# Patient Record
Sex: Male | Born: 2000 | Race: Black or African American | Hispanic: No | Marital: Single | Smoking: Former smoker
Health system: Southern US, Community
[De-identification: ages and names within clinical notes are randomized; demographics above are authoritative.]

---
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
## 2019-06-10 ENCOUNTER — Encounter (HOSPITAL_COMMUNITY): Payer: Self-pay | Admitting: Emergency Medicine

## 2019-06-10 ENCOUNTER — Emergency Department (HOSPITAL_COMMUNITY)
Admission: EM | Admit: 2019-06-10 | Discharge: 2019-06-10 | Disposition: A | Discharge status: Home / Self Care | Payer: 59 | Attending: Emergency Medicine | Admitting: Emergency Medicine

## 2019-06-10 ENCOUNTER — Other Ambulatory Visit: Payer: Self-pay

## 2019-06-10 ENCOUNTER — Emergency Department (HOSPITAL_COMMUNITY): Payer: 59

## 2019-06-10 DIAGNOSIS — Z20828 Contact with and (suspected) exposure to other viral communicable diseases: Secondary | ICD-10-CM | POA: Insufficient documentation

## 2019-06-10 DIAGNOSIS — R509 Fever, unspecified: Secondary | ICD-10-CM | POA: Insufficient documentation

## 2019-06-10 DIAGNOSIS — U071 COVID-19: Secondary | ICD-10-CM

## 2019-06-10 LAB — CBC WITH DIFFERENTIAL/PLATELET
Abs Immature Granulocytes: 0.02 10*3/uL (ref 0.00–0.07)
Basophils Absolute: 0 10*3/uL (ref 0.0–0.1)
Basophils Relative: 1 %
Eosinophils Absolute: 0 10*3/uL (ref 0.0–0.5)
Eosinophils Relative: 1 %
HCT: 42.8 % (ref 39.0–52.0)
Hemoglobin: 14 g/dL (ref 13.0–17.0)
Immature Granulocytes: 0 %
Lymphocytes Relative: 18 %
Lymphs Abs: 1.1 10*3/uL (ref 0.7–4.0)
MCH: 28.6 pg (ref 26.0–34.0)
MCHC: 32.7 g/dL (ref 30.0–36.0)
MCV: 87.3 fL (ref 80.0–100.0)
Monocytes Absolute: 0.8 10*3/uL (ref 0.1–1.0)
Monocytes Relative: 13 %
Neutro Abs: 3.9 10*3/uL (ref 1.7–7.7)
Neutrophils Relative %: 67 %
Platelets: 179 10*3/uL (ref 150–400)
RBC: 4.9 MIL/uL (ref 4.22–5.81)
RDW: 13 % (ref 11.5–15.5)
WBC: 5.8 10*3/uL (ref 4.0–10.5)
nRBC: 0 % (ref 0.0–0.2)

## 2019-06-10 LAB — URINALYSIS, ROUTINE W REFLEX MICROSCOPIC
Bilirubin Urine: NEGATIVE
Glucose, UA: NEGATIVE mg/dL
Hgb urine dipstick: NEGATIVE
Ketones, ur: NEGATIVE mg/dL
Leukocytes,Ua: NEGATIVE
Nitrite: NEGATIVE
Protein, ur: NEGATIVE mg/dL
Specific Gravity, Urine: 1.002 — ABNORMAL LOW (ref 1.005–1.030)
pH: 7 (ref 5.0–8.0)

## 2019-06-10 LAB — GROUP A STREP BY PCR: Group A Strep by PCR: NOT DETECTED

## 2019-06-10 LAB — COMPREHENSIVE METABOLIC PANEL
ALT: 15 U/L (ref 0–44)
AST: 23 U/L (ref 15–41)
Albumin: 3.7 g/dL (ref 3.5–5.0)
Alkaline Phosphatase: 54 U/L (ref 38–126)
Anion gap: 8 (ref 5–15)
BUN: 6 mg/dL (ref 6–20)
CO2: 26 mmol/L (ref 22–32)
Calcium: 8.9 mg/dL (ref 8.9–10.3)
Chloride: 105 mmol/L (ref 98–111)
Creatinine, Ser: 0.94 mg/dL (ref 0.61–1.24)
GFR calc Af Amer: >60 mL/min (ref >60)
GFR calc non Af Amer: >60 mL/min (ref >60)
Glucose, Bld: 98 mg/dL (ref 70–99)
Potassium: 3.5 mmol/L (ref 3.5–5.1)
Sodium: 139 mmol/L (ref 135–145)
Total Bilirubin: 1 mg/dL (ref 0.3–1.2)
Total Protein: 6.9 g/dL (ref 6.5–8.1)

## 2019-06-10 LAB — LACTIC ACID, PLASMA: Lactic Acid, Venous: 1 mmol/L (ref 0.5–1.9)

## 2019-06-10 MED ORDER — SODIUM CHLORIDE 0.9% FLUSH: 3.0000 mL | Freq: Once | INTRAVENOUS | Status: DC

## 2019-06-10 NOTE — ED Provider Notes (Signed)
[facility] EMERGENCY DEPARTMENT Provider Note   CSN: [id number] Arrival date & time: 06/10/19  1436     History   Chief Complaint Chief Complaint  Patient presents with  . Fever  . Headache    HPI Ronnie Marsh is a 18 y.o. male.     HPI   18yo male presents with concern for for fever, chills, sore throat, headache, body aches, generalized weakness.  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 Developed subjective fever, headache, chills.  Checked temperature today and was elevated above 101.9.  No known sick contacts but has been training in [facility] and reports covid outbreak in the area. No tick bites, no IVDU. Denies cough, dyspnea, abdominal pain, vomiting, diarrhea. Reports neck pain but also associated with diffuse body aches.  Sore throat developed over last few days.   History reviewed. No pertinent past medical history.  There are no active problems to display for this patient.   History reviewed. No pertinent surgical history.      Home Medications    Prior to Admission medications   Not on File    Family History No family history on file.  Social History Social History   Tobacco Use  . Smoking status: Never Smoker  . Smokeless tobacco: Never Used  Substance Use Topics  . Alcohol use: Never    Frequency: Never  . Drug use: Never     Allergies   Pineapple   Review of Systems Review of Systems  Constitutional: Positive for appetite change, chills, fatigue and fever.  HENT: Positive for sore throat.   Eyes: Negative for visual disturbance.  Respiratory: Negative for cough and shortness of breath.   Cardiovascular: Negative for chest pain.  Gastrointestinal: Positive for nausea. Negative for abdominal pain, diarrhea and vomiting.  Genitourinary: Negative for difficulty urinating and dysuria.  Musculoskeletal:  Positive for arthralgias, myalgias and neck pain. Negative for back pain.  Skin: Negative for rash.  Neurological: Positive for headaches. Negative for syncope, weakness and numbness.     Physical Exam Updated Vital Signs BP 132/76   Pulse (!) 58   Temp 99.1 F (37.3 C)   Resp 16   Ht 5\' 8"  (1.727 m)   Wt 58.1 kg   SpO2 100%   BMI 19.46 kg/m   Physical Exam Vitals signs and nursing note reviewed.  Constitutional:      General: He is not in acute distress.    Appearance: He is well-developed. He is not diaphoretic.  HENT:     Head: Normocephalic and atraumatic.     Mouth/Throat:     Mouth: Mucous membranes are moist.     Pharynx: Oropharynx is clear.  Eyes:     Conjunctiva/sclera: Conjunctivae normal.  Neck:     Musculoskeletal: Normal range of motion. No neck rigidity.     Meningeal: Brudzinski's sign and Kernig's sign absent.  Cardiovascular:     Rate and Rhythm: Normal rate and regular rhythm.  Pulmonary:     Effort: Pulmonary effort is normal. No respiratory distress.  Abdominal:     General: There is no distension.     Palpations: Abdomen is soft.     Tenderness: There is no abdominal tenderness. There is no guarding.  Skin:    General: Skin is warm and dry.  Neurological:     Mental Status: He is alert and oriented  to person, place, and time.      ED Treatments / Results  Labs (all labs ordered are listed, but only abnormal results are displayed) Labs Reviewed  URINALYSIS, ROUTINE W REFLEX MICROSCOPIC - Abnormal; Notable for the following components:      Result Value   Color, Urine COLORLESS (*)    Specific Gravity, Urine 1.002 (*)    All other components within normal limits  GROUP A STREP BY PCR  NOVEL CORONAVIRUS, NAA (HOSP ORDER, SEND-OUT TO REF LAB; TAT 18-24 HRS)  LACTIC ACID, PLASMA  COMPREHENSIVE METABOLIC PANEL  CBC WITH DIFFERENTIAL/PLATELET    EKG None  Radiology Dg Chest 2 View  Result Date: 06/10/2019 CLINICAL DATA:  Sore  throat fever for a few days EXAM: CHEST - 2 VIEW COMPARISON:  11/30/2018 FINDINGS: The heart size and mediastinal contours are within normal limits. Both lungs are clear. The visualized skeletal structures are unremarkable. IMPRESSION: No active cardiopulmonary disease. Electronically Signed   By: Inez Catalina M.D.   On: 06/10/2019 15:38    Procedures Procedures (including critical care time)  Medications Ordered in ED Medications - No data to display   Initial Impression / Assessment and Plan / ED Course  I have reviewed the triage vital signs and the nursing notes.  Pertinent labs & imaging results that were available during my care of the patient were reviewed by me and considered in my medical decision making (see chart for details).        18yo male presents with concern for for fever, chills, sore throat, headache, body aches, generalized weakness.  Labs obtained show no acute abnormalities. No sign of UTI or pneumonia.  Symptoms for 3 days, normal ROM of neck, no rigidity, negative kernig's/brudzinski's, doubt bacterial meningitis.  No tick exposure/IVDU.  Strep test negative, no sign of abscess on exam.  Patient well appearing, normal saturations.  Suspect most likely viral infection causing symptoms with COVID19 a possibility. Recommend quarantine until results return if negative and feeling improved, quarantine for 14 days and 72hr without fever if positive. Testing pending. Recommend supportive care. Patient discharged in stable condition with understanding of reasons to return.   Final Clinical Impressions(s) / ED Diagnoses   Final diagnoses:  Fever, unspecified fever cause  COVID-19 virus infection    ED Discharge Orders    None       Gareth Morgan, MD 06/11/19 1221

## 2019-06-10 NOTE — ED Triage Notes (Signed)
Fever this am, headache- denies covid exposure

## 2019-06-12 LAB — NOVEL CORONAVIRUS, NAA (HOSP ORDER, SEND-OUT TO REF LAB; TAT 18-24 HRS): SARS-CoV-2, NAA: NOT DETECTED

## 2019-07-27 IMAGING — DX PORTABLE CHEST - 1 VIEW
1 series · 1 of 1 positions shown · non-contrast
Comparison: None.

CLINICAL DATA: Cough and congestion for 1 week

EXAM:
PORTABLE CHEST 1 VIEW

[chest]
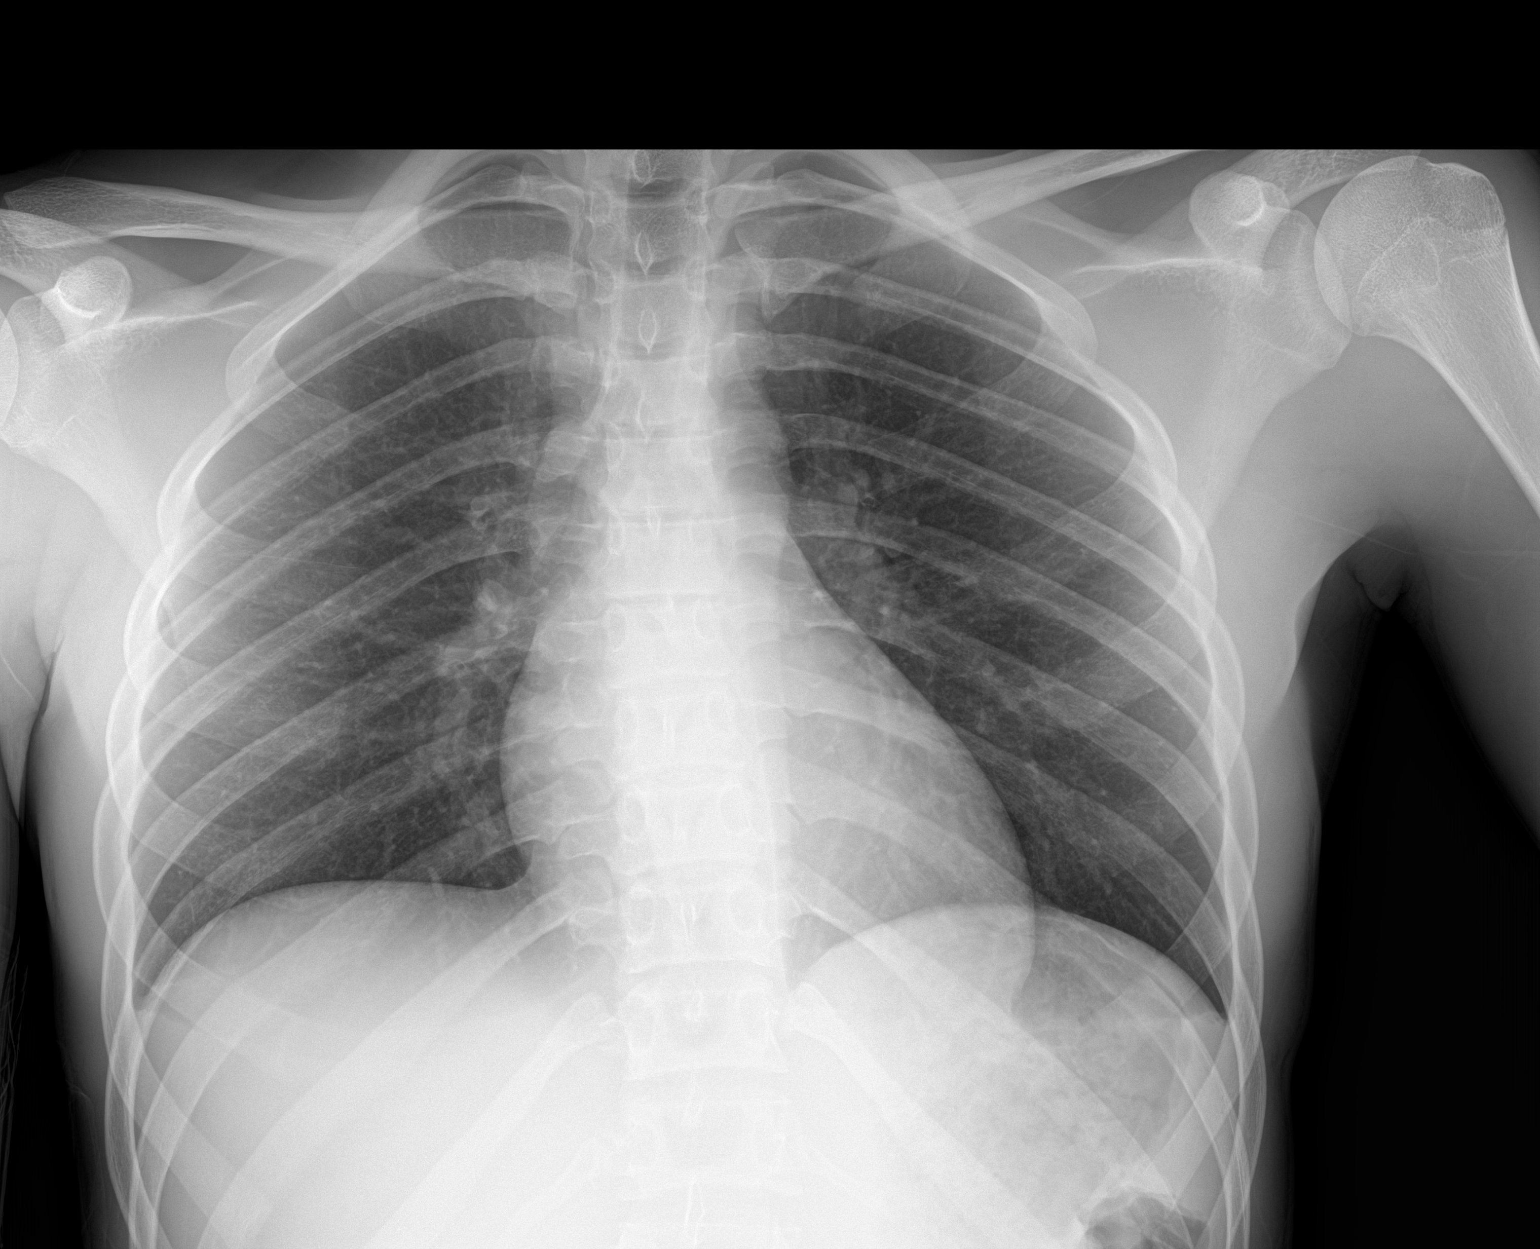

[1 of 1 positions shown; findings below may reference images not displayed]

FINDINGS: The heart size and mediastinal contours are within normal limits.
Both lungs are clear. The visualized skeletal structures are
unremarkable.
IMPRESSION: No active disease.

## 2020-06-22 ENCOUNTER — Other Ambulatory Visit (HOSPITAL_COMMUNITY)
Admission: RE | Admit: 2020-06-22 | Discharge: 2020-06-22 | Disposition: A | Discharge status: Home / Self Care | Payer: 59 | Source: Ambulatory Visit | Attending: Orthopedic Surgery | Admitting: Orthopedic Surgery

## 2020-06-22 ENCOUNTER — Other Ambulatory Visit: Payer: Self-pay

## 2020-06-22 ENCOUNTER — Encounter (HOSPITAL_BASED_OUTPATIENT_CLINIC_OR_DEPARTMENT_OTHER): Payer: Self-pay | Admitting: Orthopedic Surgery

## 2020-06-22 DIAGNOSIS — Z01812 Encounter for preprocedural laboratory examination: Secondary | ICD-10-CM | POA: Insufficient documentation

## 2020-06-22 DIAGNOSIS — Z20822 Contact with and (suspected) exposure to covid-19: Secondary | ICD-10-CM | POA: Insufficient documentation

## 2020-06-22 LAB — SARS CORONAVIRUS 2 (TAT 6-24 HRS): SARS Coronavirus 2: NEGATIVE

## 2020-06-23 ENCOUNTER — Ambulatory Visit (HOSPITAL_BASED_OUTPATIENT_CLINIC_OR_DEPARTMENT_OTHER): Payer: 59 | Admitting: Certified Registered"

## 2020-06-23 ENCOUNTER — Encounter (HOSPITAL_BASED_OUTPATIENT_CLINIC_OR_DEPARTMENT_OTHER)
Admission: RE | Disposition: A | Discharge status: Home / Self Care | Payer: Self-pay | Source: Home / Self Care | Attending: Orthopedic Surgery

## 2020-06-23 ENCOUNTER — Ambulatory Visit (HOSPITAL_BASED_OUTPATIENT_CLINIC_OR_DEPARTMENT_OTHER)
Admission: RE | Admit: 2020-06-23 | Discharge: 2020-06-23 | Disposition: A | Discharge status: Home / Self Care | Payer: 59 | Attending: Orthopedic Surgery | Admitting: Orthopedic Surgery

## 2020-06-23 ENCOUNTER — Encounter (HOSPITAL_BASED_OUTPATIENT_CLINIC_OR_DEPARTMENT_OTHER): Payer: Self-pay | Admitting: Orthopedic Surgery

## 2020-06-23 DIAGNOSIS — X58XXXA Exposure to other specified factors, initial encounter: Secondary | ICD-10-CM | POA: Diagnosis not present

## 2020-06-23 DIAGNOSIS — Z91018 Allergy to other foods: Secondary | ICD-10-CM | POA: Diagnosis not present

## 2020-06-23 DIAGNOSIS — S62636A Displaced fracture of distal phalanx of right little finger, initial encounter for closed fracture: Secondary | ICD-10-CM | POA: Insufficient documentation

## 2020-06-23 HISTORY — PX: OPEN REDUCTION INTERNAL FIXATION (ORIF) METACARPAL: SHX6234

## 2020-06-23 SURGERY — OPEN REDUCTION INTERNAL FIXATION (ORIF) METACARPAL
Anesthesia: Regional | Site: Finger | Laterality: Right

## 2020-06-23 MED ORDER — LIDOCAINE HCL (PF) 0.5 % IJ SOLN
INTRAMUSCULAR | Status: DC | PRN
  Administered 2020-06-23: 35 mL via INTRAVENOUS

## 2020-06-23 MED ORDER — LACTATED RINGERS IV SOLN: INTRAVENOUS | Status: DC

## 2020-06-23 MED ORDER — CEFAZOLIN SODIUM-DEXTROSE 2-3 GM-%(50ML) IV SOLR
INTRAVENOUS | Status: DC | PRN
  Administered 2020-06-23: 2 g via INTRAVENOUS

## 2020-06-23 MED ORDER — BUPIVACAINE HCL (PF) 0.25 % IJ SOLN
INTRAMUSCULAR | Status: DC | PRN
  Administered 2020-06-23: 8 mL

## 2020-06-23 MED ORDER — ONDANSETRON HCL 4 MG/2ML IJ SOLN
INTRAMUSCULAR | Status: AC
  Filled 2020-06-23: qty 2

## 2020-06-23 MED ORDER — FENTANYL CITRATE (PF) 100 MCG/2ML IJ SOLN
INTRAMUSCULAR | Status: DC | PRN
Start: 2020-06-23 — End: 2020-06-23
  Administered 2020-06-23 (×2): 25 ug via INTRAVENOUS

## 2020-06-23 MED ORDER — MIDAZOLAM HCL 2 MG/2ML IJ SOLN
INTRAMUSCULAR | Status: AC
  Filled 2020-06-23: qty 2

## 2020-06-23 MED ORDER — LIDOCAINE 2% (20 MG/ML) 5 ML SYRINGE
INTRAMUSCULAR | Status: AC
  Filled 2020-06-23: qty 5

## 2020-06-23 MED ORDER — PROPOFOL 500 MG/50ML IV EMUL
INTRAVENOUS | Status: DC | PRN
  Administered 2020-06-23: 50 ug/kg/min via INTRAVENOUS

## 2020-06-23 MED ORDER — CEFAZOLIN SODIUM 1 G IJ SOLR
INTRAMUSCULAR | Status: AC
  Filled 2020-06-23: qty 20

## 2020-06-23 MED ORDER — CHLORHEXIDINE GLUCONATE 4 % EX LIQD: Freq: Once | CUTANEOUS | Status: DC

## 2020-06-23 MED ORDER — ACETAMINOPHEN 500 MG PO TABS
1000.0000 mg | ORAL_TABLET | Freq: Once | ORAL | Status: AC
  Administered 2020-06-23: 1000 mg via ORAL

## 2020-06-23 MED ORDER — FENTANYL CITRATE (PF) 100 MCG/2ML IJ SOLN
INTRAMUSCULAR | Status: AC
  Filled 2020-06-23: qty 2

## 2020-06-23 MED ORDER — MIDAZOLAM HCL 5 MG/5ML IJ SOLN
INTRAMUSCULAR | Status: DC | PRN
  Administered 2020-06-23: 2 mg via INTRAVENOUS

## 2020-06-23 MED ORDER — TRAMADOL HCL 50 MG PO TABS: 50.0000 mg | ORAL_TABLET | Freq: Four times a day (QID) | ORAL | 0 refills | Status: DC | PRN

## 2020-06-23 MED ORDER — FENTANYL CITRATE (PF) 100 MCG/2ML IJ SOLN: 25.0000 ug | INTRAMUSCULAR | Status: DC | PRN

## 2020-06-23 MED ORDER — PROPOFOL 500 MG/50ML IV EMUL
INTRAVENOUS | Status: AC
  Filled 2020-06-23: qty 50

## 2020-06-23 MED ORDER — PROMETHAZINE HCL 25 MG/ML IJ SOLN: 6.2500 mg | INTRAMUSCULAR | Status: DC | PRN

## 2020-06-23 MED ORDER — BUPIVACAINE HCL (PF) 0.25 % IJ SOLN
INTRAMUSCULAR | Status: AC
  Filled 2020-06-23: qty 30

## 2020-06-23 MED ORDER — ACETAMINOPHEN 500 MG PO TABS
ORAL_TABLET | ORAL | Status: AC
  Filled 2020-06-23: qty 2

## 2020-06-23 SURGICAL SUPPLY — 53 items
APL PRP STRL LF DISP 70% ISPRP (MISCELLANEOUS) ×1
BLADE MINI RND TIP GREEN BEAV (BLADE) IMPLANT
BLADE SURG 15 STRL LF DISP TIS (BLADE) ×1 IMPLANT
BLADE SURG 15 STRL SS (BLADE) ×3
BNDG CMPR 9X4 STRL LF SNTH (GAUZE/BANDAGES/DRESSINGS) ×1
BNDG COHESIVE 3X5 TAN STRL LF (GAUZE/BANDAGES/DRESSINGS) ×3 IMPLANT
BNDG ESMARK 4X9 LF (GAUZE/BANDAGES/DRESSINGS) ×3 IMPLANT
BNDG GAUZE ELAST 4 BULKY (GAUZE/BANDAGES/DRESSINGS) ×2 IMPLANT
CHLORAPREP W/TINT 26 (MISCELLANEOUS) ×3 IMPLANT
CORD BIPOLAR FORCEPS 12FT (ELECTRODE) ×3 IMPLANT
COVER BACK TABLE 60X90IN (DRAPES) ×3 IMPLANT
COVER MAYO STAND STRL (DRAPES) ×3 IMPLANT
COVER WAND RF STERILE (DRAPES) IMPLANT
CUFF TOURN SGL QUICK 18X4 (TOURNIQUET CUFF) ×2 IMPLANT
DECANTER SPIKE VIAL GLASS SM (MISCELLANEOUS) IMPLANT
DRAPE EXTREMITY T 121X128X90 (DISPOSABLE) ×3 IMPLANT
DRAPE OEC MINIVIEW 54X84 (DRAPES) ×3 IMPLANT
DRAPE SURG 17X23 STRL (DRAPES) ×3 IMPLANT
GAUZE SPONGE 4X4 12PLY STRL (GAUZE/BANDAGES/DRESSINGS) ×3 IMPLANT
GAUZE XEROFORM 1X8 LF (GAUZE/BANDAGES/DRESSINGS) ×3 IMPLANT
GLOVE BIO SURGEON STRL SZ 6.5 (GLOVE) ×2 IMPLANT
GLOVE BIO SURGEONS STRL SZ 6.5 (GLOVE) ×2
GLOVE BIOGEL PI IND STRL 7.0 (GLOVE) IMPLANT
GLOVE BIOGEL PI IND STRL 8 (GLOVE) IMPLANT
GLOVE BIOGEL PI IND STRL 8.5 (GLOVE) ×1 IMPLANT
GLOVE BIOGEL PI INDICATOR 7.0 (GLOVE) ×2
GLOVE BIOGEL PI INDICATOR 8 (GLOVE) ×2
GLOVE BIOGEL PI INDICATOR 8.5 (GLOVE) ×2
GLOVE SURG ORTHO 8.0 STRL STRW (GLOVE) ×3 IMPLANT
GOWN STRL REUS W/ TWL LRG LVL3 (GOWN DISPOSABLE) IMPLANT
GOWN STRL REUS W/TWL LRG LVL3 (GOWN DISPOSABLE)
GOWN STRL REUS W/TWL XL LVL3 (GOWN DISPOSABLE) ×3 IMPLANT
NDL PRECISIONGLIDE 27X1.5 (NEEDLE) IMPLANT
NEEDLE PRECISIONGLIDE 27X1.5 (NEEDLE) ×3 IMPLANT
NS IRRIG 1000ML POUR BTL (IV SOLUTION) ×3 IMPLANT
PACK BASIN DAY SURGERY FS (CUSTOM PROCEDURE TRAY) ×3 IMPLANT
PAD CAST 3X4 CTTN HI CHSV (CAST SUPPLIES) ×1 IMPLANT
PADDING CAST ABS 4INX4YD NS (CAST SUPPLIES) ×2
PADDING CAST ABS COTTON 4X4 ST (CAST SUPPLIES) ×1 IMPLANT
PADDING CAST COTTON 3X4 STRL (CAST SUPPLIES) ×3
SLEEVE SCD COMPRESS KNEE MED (MISCELLANEOUS) IMPLANT
SPLINT FINGER 3.25 BULB 911905 (SOFTGOODS) ×2 IMPLANT
SPLINT PLASTER CAST XFAST 3X15 (CAST SUPPLIES) IMPLANT
SPLINT PLASTER XTRA FASTSET 3X (CAST SUPPLIES)
STOCKINETTE 4X48 STRL (DRAPES) ×3 IMPLANT
SUT CHROMIC 5 0 P 3 (SUTURE) IMPLANT
SUT ETHILON 4 0 PS 2 18 (SUTURE) ×3 IMPLANT
SUT MERSILENE 4 0 P 3 (SUTURE) IMPLANT
SUT VICRYL 4-0 PS2 18IN ABS (SUTURE) IMPLANT
SYR BULB EAR ULCER 3OZ GRN STR (SYRINGE) ×3 IMPLANT
SYR CONTROL 10ML LL (SYRINGE) ×2 IMPLANT
TOWEL GREEN STERILE FF (TOWEL DISPOSABLE) ×3 IMPLANT
UNDERPAD 30X36 HEAVY ABSORB (UNDERPADS AND DIAPERS) ×3 IMPLANT

## 2020-06-23 NOTE — Discharge Instructions (Signed)
Next dose of Tylenol can be given at 1:45pm if needed.    Post Anesthesia Home Care Instructions  Activity: Get plenty of rest for the remainder of the day. A responsible individual must stay with you for 24 hours following the procedure.  For the next 24 hours, DO NOT: -Drive a car -Advertising copywriter -Drink alcoholic beverages -Take any medication unless instructed by your physician -Make any legal decisions or sign important papers.  Meals: Start with liquid foods such as gelatin or soup. Progress to regular foods as tolerated. Avoid greasy, spicy, heavy foods. If nausea and/or vomiting occur, drink only clear liquids until the nausea and/or vomiting subsides. Call your physician if vomiting continues.  Special Instructions/Symptoms: Your throat may feel dry or sore from the anesthesia or the breathing tube placed in your throat during surgery. If this causes discomfort, gargle with warm salt water. The discomfort should disappear within 24 hours.   [facility] Instructions Hand Surgery  Wound Care: Keep your hand elevated above the level of your heart.  Do not allow it to dangle by your side.  Keep the dressing dry and do not remove it unless your doctor advises you to do so.  He will usually change it at the time of your post-op visit.  Moving your fingers is advised to stimulate circulation but will depend on the site of your surgery.  If you have a splint applied, your doctor will advise you regarding movement.  Activity: Do not drive or operate machinery today.  Rest today and then you may return to your normal activity and work as indicated by your physician.  Diet:  Drink liquids today or eat a light diet.  You may resume a regular diet tomorrow.    General expectations: Pain for two to three days. Fingers may become slightly swollen.  Call your doctor if any of the following occur: Severe pain not relieved by pain medication. Elevated temperature. Dressing soaked  with blood. Inability to move fingers. White or bluish color to fingers.

## 2020-06-23 NOTE — Transfer of Care (Signed)
Immediate Anesthesia Transfer of Care Note  Patient: Ronnie Marsh  Procedure(s) Performed: OPEN REDUCTION INTERNAL FIXATION (ORIF) METACARPA RIGHT SMALL FINGER DISTAL PHALANX (Right Finger)  Patient Location: PACU  Anesthesia Type:MAC and Bier block  Level of Consciousness: awake, alert  and oriented  Airway & Oxygen Therapy: Patient Spontanous Breathing and Patient connected to face mask oxygen  Post-op Assessment: Report given to RN and Post -op Vital signs reviewed and stable  Post vital signs: Reviewed and stable  Last Vitals:  Vitals Value Taken Time  BP    Temp    Pulse    Resp    SpO2      Last Pain:  Vitals:   06/23/20 0714  TempSrc: Oral  PainSc: 4          Complications: No complications documented.

## 2020-06-23 NOTE — Anesthesia Postprocedure Evaluation (Signed)
Anesthesia Post Note  Patient: Ronnie Marsh  Procedure(s) Performed: OPEN REDUCTION INTERNAL FIXATION (ORIF) METACARPA RIGHT SMALL FINGER DISTAL PHALANX (Right Finger)     Patient location during evaluation: PACU Anesthesia Type: Bier Block Level of consciousness: awake and alert, awake and oriented Pain management: pain level controlled Vital Signs Assessment: post-procedure vital signs reviewed and stable Respiratory status: spontaneous breathing, nonlabored ventilation and respiratory function stable Cardiovascular status: stable and blood pressure returned to baseline Postop Assessment: no apparent nausea or vomiting Anesthetic complications: no   No complications documented.  Last Vitals:  Vitals:   06/23/20 0953 06/23/20 1001  BP: (!) 148/83 128/82  Pulse: (!) 50 (!) 50  Resp: 14 16  Temp:  36.6 C  SpO2: 100% 100%    Last Pain:  Vitals:   06/23/20 1001  TempSrc:   PainSc: 0-No pain                 Cecile Hearing

## 2020-06-23 NOTE — Anesthesia Preprocedure Evaluation (Addendum)
Anesthesia Evaluation  Patient identified by MRN, date of birth, ID band Patient awake    Reviewed: Allergy & Precautions, NPO status , Patient's Chart, lab work & pertinent test results  Airway Mallampati: II  TM Distance: >3 FB Neck ROM: Full    Dental  (+) Teeth Intact   Pulmonary neg pulmonary ROS,    Pulmonary exam normal breath sounds clear to auscultation       Cardiovascular Exercise Tolerance: Good negative cardio ROS Normal cardiovascular exam Rhythm:Regular Rate:Normal     Neuro/Psych negative neurological ROS  negative psych ROS   GI/Hepatic negative GI ROS, Neg liver ROS,   Endo/Other  negative endocrine ROS  Renal/GU negative Renal ROS     Musculoskeletal MALUNION DISTAL PHALANX RIGHT SMALL FINGER   Abdominal   Peds  Hematology negative hematology ROS (+)   Anesthesia Other Findings   Reproductive/Obstetrics                            Anesthesia Physical Anesthesia Plan  ASA: I  Anesthesia Plan: Bier Block and Bier Block-LIDOCAINE ONLY   Post-op Pain Management:    Induction: Intravenous  PONV Risk Score and Plan: 1 and Propofol infusion and Treatment may vary due to age or medical condition  Airway Management Planned: Natural Airway and Nasal Cannula  Additional Equipment:   Intra-op Plan:   Post-operative Plan:   Informed Consent: I have reviewed the patients History and Physical, chart, labs and discussed the procedure including the risks, benefits and alternatives for the proposed anesthesia with the patient or authorized representative who has indicated his/her understanding and acceptance.       Plan Discussed with: CRNA  Anesthesia Plan Comments:         Anesthesia Quick Evaluation

## 2020-06-23 NOTE — Op Note (Signed)
NAME: Ronnie Marsh MEDICAL RECORD NO: [id number] DATE OF BIRTH: [birth year] FACILITY: [facility] LOCATION: [facility] PHYSICIAN: Nicki Reaper, MD   OPERATIVE REPORT   DATE OF PROCEDURE: 06/23/20    PREOPERATIVE DIAGNOSIS:   Fracture distal phalanx right small finger with subluxation distal phalangeal joint   POSTOPERATIVE DIAGNOSIS:   Same   PROCEDURE:   Open reduction internal fixation distal phalanx fracture right small finger   SURGEON: Cindee Salt, M.D.   ASSISTANT: Betha Loa, MD   ANESTHESIA:  Bier block with sedation and Local   INTRAVENOUS FLUIDS:  Per anesthesia flow sheet.   ESTIMATED BLOOD LOSS:  Minimal.   COMPLICATIONS:  None.   SPECIMENS:  none   TOURNIQUET TIME:    Total Tourniquet Time Documented: Upper Arm (Right) - 37 minutes Total: Upper Arm (Right) - 37 minutes    DISPOSITION:  Stable to PACU.   INDICATIONS: Patient is a 19 year old male who sustained a fracture of his distal phalanx approximately 4 weeks prior to being seen.  Is not recall exact mechanism of injury.  X-rays reveal that there is subluxation of the distal phalanx from the middle phalanx with a dorsal fracture which is significantly displaced.  Pre-peripostoperative course been discussed along with risks and complications after discussing various treatment alternatives he is elected undergo surgical open reduction internal fixation with being fully aware that he may still develop arthritic changes within the joint.  Preoperative area the patient seen the extremity marked with patient and surgeon antibiotic given  OPERATIVE COURSE: Patient is brought to the operating room where form based IV regional anesthetic was carried out without difficulty under the direction of the anesthesia department with the right arm free.  He was prepped with ChloraPrep..  A 3-minute dry time was allowed timeout to confirm patient procedure.  A metacarpal block was given quarter percent  bupivacaine without epinephrine 9 cc was used.  A H incision was made over the distal phalangeal joint of the right small finger carried down through subcutaneous tissue.  The fracture was identified after placing a 25-gauge needle and using image intensification confirm that what it was in the fracture.  This was then opened with a Beaver blade.  The joint was then stretched using a freer elevator to allow reduction of the distal component.  2-8 K wires were then passed through the distal phalanx through the tip of the finger in an antegrade manner.  These were done in the center of the fracture.  The fracture was then manipulated and placed into the defect reducing it.  The the 2 8 K wires were then advanced into the middle phalanx site securing the joint in a reduced position with the fracture reduced.  X-rays were used to confirm the position of the joint and fracture fragments in position.  The wound was copious irrigated with saline.  Skin was closed interrupted 4-0 nylon sutures.  Sterile compressive dressing and splint to the finger was applied.  Inflation of the tourniquet remaining fingers pink.  He was taken to the recovery room for observation in satisfactory condition.  He will be discharged home to return to the hand center [facility] in 1 week on Tylenol ibuprofen for pain with Ultram for breakthrough.   Cindee Salt, MD Electronically signed, 06/23/20

## 2020-06-23 NOTE — H&P (Signed)
Ronnie Marsh is an 19 y.o. male.   Chief Complaint:pain right small finger  HPI: Ronnie Marsh is a 19 year old right-hand-dominant who sustained an injury to his right small finger on 05/27/2020. He has no prior history of injury does not know exactly what happened. Did not seek medical attention until last week when he was seen at [facility] and x-rays were taken revealing a fracture. He is a states he was able to move until that time. He has no prior history of injuries complained of mild to moderate pain at the present time. He has no prior history of problems with this. Is no history of diabetes thyroid problems arthritis or gout. He was placed in a splint and referred. Family history is negative for diabetes thyroid problems arthritis and gout.   History reviewed. No pertinent past medical history.  History reviewed. No pertinent surgical history.  History reviewed. No pertinent family history. Social History:  reports that he has never smoked. He has never used smokeless tobacco. He reports that he does not drink alcohol and does not use drugs.  Allergies:  Allergies  Allergen Reactions  . Pineapple Itching    Raw pineapple makes his tongue/mouth itch    No medications prior to admission.    Results for orders placed or performed during the hospital encounter of 06/22/20 (from the past 48 hour(s))  SARS CORONAVIRUS 2 (TAT 6-24 HRS) Nasopharyngeal Nasopharyngeal Swab     Status: None   Collection Time: 06/22/20  1:41 PM   Specimen: Nasopharyngeal Swab  Result Value Ref Range   SARS Coronavirus 2 NEGATIVE NEGATIVE    Comment: (NOTE) SARS-CoV-2 target nucleic acids are NOT DETECTED.  The SARS-CoV-2 RNA is generally detectable in upper and lower respiratory specimens during the acute phase of infection. Negative results do not preclude SARS-CoV-2 infection, do not rule out co-infections with other pathogens, and should not be used as the sole basis for treatment or other  patient management decisions. Negative results must be combined with clinical observations, patient history, and epidemiological information. The expected result is Negative.  Fact Sheet for Patients: HairSlick.no  Fact Sheet for Healthcare Providers: quierodirigir.com  This test is not yet approved or cleared by the [place] FDA and  has been authorized for detection and/or diagnosis of SARS-CoV-2 by FDA under an Emergency Use Authorization (EUA). This EUA will remain  in effect (meaning this test can be used) for the duration of the COVID-19 declaration under Se ction 564(b)(1) of the Act, 21 U.S.C. section 360bbb-3(b)(1), unless the authorization is terminated or revoked sooner.  Performed at [facility], [address], [place], [place] [address]     No results found.   Pertinent items are noted in HPI.  Height 5\' 8"  (1.727 m), weight 61.2 kg.  General appearance: alert, cooperative and appears stated age Head: Normocephalic, without obvious abnormality Neck: no JVD Resp: clear to auscultation bilaterally Cardio: regular rate and rhythm, S1, S2 normal, no murmur, click, rub or gallop GI: soft, non-tender; bowel sounds normal; no masses,  no organomegaly Extremities: pain right small finger Pulses: 2+ and symmetric Skin: Skin color, texture, turgor normal. No rashes or lesions Neurologic: Grossly normal Incision/Wound: na Assessment/Plan Assessment:  1. Closed displaced fracture of distal phalanx of right small  finger  Plan: We have discussed 2 options with him one is to allow this to heal and see if he can regain mobility with the probability of arthritis occurring the second would be to  take the healing process down and attempt to stabilize this in a more normal position. Prepare postoperative course are discussed along with risk and complications. He is aware of the potential for stiffness with  intervention surgically at this time with the possibility of arthritic change despite internal fixation or pinning he would like to proceed with attempting to repair this. This will be scheduled as an outpatient for open reduction internal fixation fracture distal phalanx right small finger   Cindee Salt 06/23/2020, 5:42 AM

## 2020-06-23 NOTE — Op Note (Signed)
I assisted Surgeon(s) and Role:    * Cindee Salt, MD - Primary    Betha Loa, MD on the Procedure(s): OPEN REDUCTION INTERNAL FIXATION (ORIF) METACARPA RIGHT SMALL FINGER DISTAL PHALANX on 06/23/2020.  I provided assistance on this case as follows: retraction soft tissues, placement pins.  Electronically signed by: Betha Loa, MD Date: 06/23/2020 Time: 9:36 AM

## 2020-06-23 NOTE — Brief Op Note (Signed)
06/23/2020  9:35 AM  PATIENT:  Ronnie Marsh  19 y.o. male  PRE-OPERATIVE DIAGNOSIS:  MALUNION DISTAL PHALANX RIGHT SMALL FINGER  POST-OPERATIVE DIAGNOSIS:  MALUNION DISTAL PHALANX RIGHT SMALL FINGER  PROCEDURE:  Procedure(s): OPEN REDUCTION INTERNAL FIXATION (ORIF) METACARPA RIGHT SMALL FINGER DISTAL PHALANX (Right)  SURGEON:  Surgeon(s) and Role:    * Cindee Salt, MD - Primary    * Betha Loa, MD  PHYSICIAN ASSISTANT:   ASSISTANTS: K Kiara Keep,MD   ANESTHESIA:   local, regional and IV sedation  EBL:53ml  BLOOD ADMINISTERED:none  DRAINS: none   LOCAL MEDICATIONS USED:  BUPIVICAINE   SPECIMEN:  No Specimen  DISPOSITION OF SPECIMEN:  N/A  COUNTS:  YES  TOURNIQUET:   Total Tourniquet Time Documented: Upper Arm (Right) - 37 minutes Total: Upper Arm (Right) - 37 minutes   DICTATION: .Reubin Milan Dictation  PLAN OF CARE: Discharge to home after PACU  PATIENT DISPOSITION:  PACU - hemodynamically stable.

## 2020-06-24 ENCOUNTER — Encounter (HOSPITAL_BASED_OUTPATIENT_CLINIC_OR_DEPARTMENT_OTHER): Payer: Self-pay | Admitting: Orthopedic Surgery

## 2020-09-28 ENCOUNTER — Encounter: Payer: Self-pay | Admitting: Family

## 2021-03-14 ENCOUNTER — Encounter: Payer: Self-pay | Admitting: Infectious Diseases

## 2021-03-14 ENCOUNTER — Telehealth: Payer: Self-pay

## 2021-03-14 ENCOUNTER — Ambulatory Visit (INDEPENDENT_AMBULATORY_CARE_PROVIDER_SITE_OTHER): Payer: 59 | Admitting: Infectious Diseases

## 2021-03-14 ENCOUNTER — Ambulatory Visit (INDEPENDENT_AMBULATORY_CARE_PROVIDER_SITE_OTHER): Payer: BC Managed Care – PPO | Admitting: Pharmacist

## 2021-03-14 ENCOUNTER — Other Ambulatory Visit (HOSPITAL_COMMUNITY): Payer: Self-pay

## 2021-03-14 ENCOUNTER — Other Ambulatory Visit: Payer: Self-pay

## 2021-03-14 VITALS — BP 129/83 | HR 66 | Temp 98.3°F | Ht 68.0 in | Wt 131.0 lb

## 2021-03-14 DIAGNOSIS — Z21 Asymptomatic human immunodeficiency virus [HIV] infection status: Secondary | ICD-10-CM

## 2021-03-14 DIAGNOSIS — B20 Human immunodeficiency virus [HIV] disease: Secondary | ICD-10-CM

## 2021-03-14 DIAGNOSIS — F4321 Adjustment disorder with depressed mood: Secondary | ICD-10-CM

## 2021-03-14 DIAGNOSIS — A5139 Other secondary syphilis of skin: Secondary | ICD-10-CM | POA: Diagnosis not present

## 2021-03-14 MED ORDER — PENICILLIN G BENZATHINE 1200000 UNIT/2ML IM SUSY
1.2000 10*6.[IU] | PREFILLED_SYRINGE | Freq: Once | INTRAMUSCULAR | Status: AC
  Administered 2021-03-14: 1.2 10*6.[IU] via INTRAMUSCULAR

## 2021-03-14 MED ORDER — BIKTARVY 50-200-25 MG PO TABS
1.0000 | ORAL_TABLET | Freq: Every day | ORAL | 5 refills | Status: DC
Start: 2021-03-14 — End: 2021-09-25
  Filled 2021-03-14 (×2): qty 30, 30d supply, fill #0
  Filled 2021-04-05: qty 30, 30d supply, fill #1
  Filled 2021-05-05: qty 30, 30d supply, fill #2
  Filled 2021-05-30: qty 30, 30d supply, fill #3
  Filled 2021-07-07: qty 30, 30d supply, fill #4
  Filled 2021-08-11: qty 30, 30d supply, fill #5

## 2021-03-14 NOTE — Telephone Encounter (Signed)
[facility] Patient Advocate Encounter °  °Was successful in obtaining a [employer] copay card for Biktarvy.  This copay card will make the patients copay 0.00. ° °I have spoken with the patient.   ° °The billing information is as follows and has been shared with [facility]. ° ° ° ° ° ° ° °Alyxander Kollmann, CPhT °Specialty Pharmacy Patient Advocate °[facility] °Phone: [phone] °Fax:  [phone]  °

## 2021-03-14 NOTE — Patient Instructions (Addendum)
Nice to meet you!  We gave you your syphilis treatment today  Ronnie Marsh is the pill I would like for you to start taking to treat you - this will need to be taken once a day around the same time.  - Common side effects for a short time frame usually include headaches, nausea and diarrhea - OK to take over the counter tylenol for headaches and imodium for diarrhea - Try taking with food if you are nauseated  - If you take any multivitamins or supplements please separate them from your Biktarvy by 6 hours before and after.  The main thing is do not have them in the stomach at the same time.  Will send your medicine to a pharmacy that does a great job and can mail it to you as well.  You may need a copay card to help with affording your medication   Will see you back in 1 month.

## 2021-03-14 NOTE — Progress Notes (Signed)
Name: MCKINLEY OLHEISER  DOB: [birth year] MRN: [id number] PCP: Nickola Major, MD     Brief Narrative:  SHAWN CARATTINI is a 20 y.o. male recently diagnosed with HIV 03/03/2021.  CD4 nadir unknown VL  HIV Risk: sexual,  History of OIs: none Intake Labs 03/14/2021: Hep B sAg (), sAb (), cAb (); Hep A (), Hep C () Quantiferon () HLA B*5701 () G6PD: ()   Previous Regimens: Treatment nave  Genotypes: Pending 03/14/2021  Subjective:   Chief Complaint  Patient presents with   New Patient (Initial Visit)    B20      HPI: Finas is here for his first visit to start treatment for HIV infection.  He says he has never been tested for HIV before recently.  He established with a new primary care provider and underwent routine STI testing with reactive HIV antibody and RPR.  He states that he has been sexually active (receptive partner) with males since the age of 40.  Onset of sexual activity with females at the age of 10.    Recently tested for RPR 1:64 and has not had treatment for this yet.  He does report that he had some skin tags come up around the perianal area that had some clear discharge associated with it.  He has had several spots pop up since then.  Denies any other rashes over the chest hands or soles.  No fevers, lymphadenopathy or penile drainage.  Lives in [place] locally, works at [employer]. Grew up in [place] and vaccinated as a child per routine. Had meningitis and last Tdap vaccine recently through his PCP.  He is eating and sleeping well.  Mood is overall okay, still going through adjustment period with new diagnosis.    Depression screen PHQ 2/9 03/14/2021  Decreased Interest 1  Down, Depressed, Hopeless 1  PHQ - 2 Score 2  Altered sleeping 0  Tired, decreased energy 0  Change in appetite 0  Feeling bad or failure about yourself  0  Trouble concentrating 2  Moving slowly or fidgety/restless 1  Suicidal thoughts 0  PHQ-9 Score 5  Difficult doing work/chores  Somewhat difficult    Review of Systems  Constitutional:  Negative for appetite change, chills, fatigue, fever and unexpected weight change.  HENT:  Negative for sore throat.   Eyes:  Negative for visual disturbance.  Respiratory:  Negative for cough and shortness of breath.   Cardiovascular:  Negative for chest pain and leg swelling.  Gastrointestinal:  Negative for abdominal pain, anal bleeding, diarrhea, nausea and rectal pain.       Perirectal lesions  Genitourinary:  Negative for dysuria, genital sores, penile discharge, penile pain, penile swelling, scrotal swelling and testicular pain.  Musculoskeletal:  Negative for arthralgias and joint swelling.  Skin:  Negative for color change and rash.  Neurological:  Negative for dizziness and headaches.  Hematological:  Negative for adenopathy.  Psychiatric/Behavioral:  Negative for sleep disturbance. The patient is not nervous/anxious.     Past Medical History:  Diagnosis Date   Asymptomatic HIV infection, with no history of HIV-related illness ([place]) 03/15/2021   Syphilis 03/15/2021    Outpatient Medications Prior to Visit  Medication Sig Dispense Refill   traMADol (ULTRAM) 50 MG tablet Take 1 tablet (50 mg total) by mouth every 6 (six) hours as needed. (Patient not taking: Reported on 03/14/2021) 20 tablet 0   No facility-administered medications prior to visit.     Allergies  Allergen Reactions  Pineapple Itching    Raw pineapple makes his tongue/mouth itch    Social History   Tobacco Use   Smoking status: Some Days    Pack years: 0.00   Smokeless tobacco: Never  Vaping Use   Vaping Use: Never used  Substance Use Topics   Alcohol use: Yes    Comment: occ   Drug use: Not Currently    Types: Marijuana    No family history on file.  Social History   Substance and Sexual Activity  Sexual Activity Not Currently   Partners: Male   Comment: agreed condoms     Objective:   Vitals:   03/14/21 1030  BP: 129/83   Pulse: 66  Temp: 98.3 F (36.8 C)  TempSrc: Oral  Weight: 131 lb (59.4 kg)  Height: 5' 8"  (1.727 m)   Body mass index is 19.92 kg/m.  Physical Exam HENT:     Mouth/Throat:     Mouth: No oral lesions.     Dentition: Normal dentition. No dental caries.  Eyes:     General: No scleral icterus. Cardiovascular:     Rate and Rhythm: Normal rate and regular rhythm.     Heart sounds: Normal heart sounds.  Pulmonary:     Effort: Pulmonary effort is normal.     Breath sounds: Normal breath sounds.  Abdominal:     General: There is no distension.     Palpations: Abdomen is soft.     Tenderness: There is no abdominal tenderness.  Lymphadenopathy:     Cervical: No cervical adenopathy.  Skin:    General: Skin is warm and dry.     Findings: No rash.  Neurological:     Mental Status: He is alert and oriented to person, place, and time.    Lab Results Lab Results  Component Value Date   WBC 3.9 03/14/2021   HGB 14.7 03/14/2021   HCT 44.1 03/14/2021   MCV 85.1 03/14/2021   PLT 231 03/14/2021    Lab Results  Component Value Date   CREATININE 0.97 03/14/2021   BUN 12 03/14/2021   NA 138 03/14/2021   K 4.2 03/14/2021   CL 100 03/14/2021   CO2 31 03/14/2021    Lab Results  Component Value Date   ALT 9 03/14/2021   AST 16 03/14/2021   ALKPHOS 54 06/10/2019   BILITOT 0.4 03/14/2021    No results found for: CHOL, HDL, LDLCALC, LDLDIRECT, TRIG, CHOLHDL CD4 T Cell Abs (/uL)  Date Value  03/14/2021 65 (L)     Assessment & Plan:   Problem List Items Addressed This Visit       Unprioritized   Secondary syphilis, condyloma lata    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  We will go ahead and get him treated with 2,400,000 units of Bicillin IM today.  Note written for work to excuse from today due to soreness of the injection site. Will update health department and follow-up RPR in 3 to 6 months.  Or if concerning for new infection. Safe sex  counseling provided today.       Relevant Medications   bictegravir-emtricitabine-tenofovir AF (BIKTARVY) 50-200-25 MG TABS tablet   sulfamethoxazole-trimethoprim (BACTRIM) 400-80 MG tablet   Asymptomatic HIV infection, with no history of HIV-related illness ([place]) - Primary    New patient here to establish for HIV care.  Nave to treatment but ready to start.  No AIDS defining illnesses on exam and likely acquisition ~3 years ago based  off his history.  We will draw pertinent lab work to stage his condition today and for therapeutic response after initiation of antiretrovirals.  I discussed with Trecia Rogers treatment options/side effects, benefits of treatment and long-term outcomes. I discussed how HIV is transmitted and the process of untreated HIV including increased risk for opportunistic infections, cancer, dementia and renal failure. Patient was counseled on routine HIV care including medication adherence, blood monitoring, necessary vaccines and follow up visits. Counseled regarding safe sex practices including: condom use, partner disclosure, limiting partners. Patient spent time talking with our pharmacist Estill Bamberg regarding successful practices of ART and understands to reach out to our clinic in the future with questions.   Will start [place] for HIV treatment.  He currently has access to health insurance and will send to [employer].   General introduction to our clinic and integrated services.  Dental referral placed today for [facility]. Information to schedule appointment completed today.    We will have him return in 1 month to assess adherence, tolerability and repeat viral load.  We will also discuss routine vaccination at next office visit as well and obtain his pediatric vaccination records in the meantime. I spent greater than 45 minutes with the patient today with > 80% spent face-to-face counseling and coordination of care re: HIV and health maintenance.          Relevant Medications   bictegravir-emtricitabine-tenofovir AF (BIKTARVY) 50-200-25 MG TABS tablet   sulfamethoxazole-trimethoprim (BACTRIM) 400-80 MG tablet   Other Relevant Orders   T-helper cell (CD4)- ([facility] clinic only) (Completed)   HIV-1 RNA ultraquant reflex to gentyp+   Hepatitis B surface antigen   Hepatitis B surface antibody,qualitative   Hepatitis A antibody, total   RPR (Completed)   COMPLETE METABOLIC PANEL WITH GFR (Completed)   CBC with Differential/Platelet (Completed)   Adjustment reaction    Depression screen [facility] 2/9 03/14/2021  Decreased Interest 1  Down, Depressed, Hopeless 1  PHQ - 2 Score 2  Altered sleeping 0  Tired, decreased energy 0  Change in appetite 0  Feeling bad or failure about yourself  0  Trouble concentrating 2  Moving slowly or fidgety/restless 1  Suicidal thoughts 0  PHQ-9 Score 5  Difficult doing work/chores Somewhat difficult  No indication for any medications at this time.  We did discuss that if he has prolonged or experiences more severe dysphoria we can have him set up for counseling with [facility].        Janene Madeira, MSN, NP-C [facility] for Infectious [place] Pager: [phone] Office: [phone]  03/15/21  10:17 AM

## 2021-03-14 NOTE — Progress Notes (Signed)
HPI: Ronnie Marsh is a 20 y.o. male who presents to the [facility] clinic today to initiate care for a newly diagnosed HIV infection.  There are no problems to display for this patient.   Patient's Medications  New Prescriptions   No medications on file  Previous Medications   BICTEGRAVIR-EMTRICITABINE-TENOFOVIR AF (BIKTARVY) 50-200-25 MG TABS TABLET    Take 1 tablet by mouth daily.   TRAMADOL (ULTRAM) 50 MG TABLET    Take 1 tablet (50 mg total) by mouth every 6 (six) hours as needed.  Modified Medications   No medications on file  Discontinued Medications   No medications on file    Allergies: Allergies  Allergen Reactions   Pineapple Itching    Raw pineapple makes his tongue/mouth itch    Past Medical History: No past medical history on file.  Social History: Social History   Socioeconomic History   Marital status: Single    Spouse name: Not on file   Number of children: Not on file   Years of education: Not on file   Highest education level: Not on file  Occupational History   Not on file  Tobacco Use   Smoking status: Some Days    Pack years: 0.00   Smokeless tobacco: Never  Vaping Use   Vaping Use: Never used  Substance and Sexual Activity   Alcohol use: Yes    Comment: occ   Drug use: Not Currently    Types: Marijuana   Sexual activity: Not Currently    Partners: Male    Comment: agreed condoms  Other Topics Concern   Not on file  Social History Narrative   Not on file   Social Determinants of Health   Financial Resource Strain: Not on file  Food Insecurity: Not on file  Transportation Needs: Not on file  Physical Activity: Not on file  Stress: Not on file  Social Connections: Not on file    Labs: No results found for: HIV1RNAQUANT, HIV1RNAVL, CD4TABS  RPR and STI No results found for: LABRPR, RPRTITER  No flowsheet data found.  Hepatitis B No results found for: HEPBSAB, HEPBSAG, HEPBCAB Hepatitis C No results found for: HEPCAB,  HCVRNAPCRQN Hepatitis A No results found for: HAV Lipids: No results found for: CHOL, TRIG, HDL, CHOLHDL, VLDL, LDLCALC  Current HIV Regimen: Treatment naive  Assessment: Ronnie Marsh is here today for HIV treatment rapid initiation. He is newly diagnosed and will be started on Biktarvy today.   Explained that Ronnie Marsh is a one pill once daily medication with or without food and the importance of not missing any doses. Explained resistance and how it develops and why it is so important to take Biktarvy daily and not skip days or doses. Counseled patient to take it around the same time each day. Counseled on what to do if dose is missed, if closer to missed dose take immediately, if closer to next dose then skip and resume normal schedule.   Cautioned on possible side effects the first week or so including nausea, diarrhea, dizziness, and headaches but that they should resolve after the first couple of weeks. I reviewed patient medications and found no drug interactions. Counseled patient to separate Biktarvy from divalent cations including multivitamins. Discussed with patient to call clinic if he starts a new medication or herbal supplement. I gave the patient my card and told him to call me with any issues/questions/concerns.  He is insured and will get it mailed from [facility].  Plan: - Start Biktarvy  Marilla Boddy L. Jannette Fogo, PharmD, BCIDP, AAHIVP, CPP Clinical Pharmacist Practitioner Infectious Diseases Clinical Pharmacist [facility] 03/14/2021, 11:55 AM

## 2021-03-15 ENCOUNTER — Encounter: Payer: Self-pay | Admitting: Infectious Diseases

## 2021-03-15 ENCOUNTER — Other Ambulatory Visit (HOSPITAL_COMMUNITY): Payer: Self-pay

## 2021-03-15 DIAGNOSIS — Z8619 Personal history of other infectious and parasitic diseases: Secondary | ICD-10-CM | POA: Insufficient documentation

## 2021-03-15 DIAGNOSIS — A539 Syphilis, unspecified: Secondary | ICD-10-CM

## 2021-03-15 DIAGNOSIS — F4320 Adjustment disorder, unspecified: Secondary | ICD-10-CM | POA: Insufficient documentation

## 2021-03-15 DIAGNOSIS — Z21 Asymptomatic human immunodeficiency virus [HIV] infection status: Secondary | ICD-10-CM

## 2021-03-15 HISTORY — DX: Syphilis, unspecified: A53.9

## 2021-03-15 HISTORY — DX: Asymptomatic human immunodeficiency virus (hiv) infection status: Z21

## 2021-03-15 LAB — T-HELPER CELL (CD4) - (RCID CLINIC ONLY)
CD4 % Helper T Cell: 10 % — ABNORMAL LOW (ref 33–65)
CD4 T Cell Abs: 65 /uL — ABNORMAL LOW (ref 400–1790)

## 2021-03-15 MED ORDER — SULFAMETHOXAZOLE-TRIMETHOPRIM 400-80 MG PO TABS
1.0000 | ORAL_TABLET | Freq: Every day | ORAL | 4 refills | Status: DC
  Filled 2021-03-15 – 2021-04-05 (×2): qty 30, 30d supply, fill #0
  Filled 2021-05-05: qty 30, 30d supply, fill #1
  Filled 2021-05-30: qty 30, 30d supply, fill #2
  Filled 2021-07-07: qty 30, 30d supply, fill #3
  Filled 2021-08-11: qty 30, 30d supply, fill #4

## 2021-03-15 NOTE — Assessment & Plan Note (Signed)
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  We will go ahead and get him treated with 2,400,000 units of Bicillin IM today.  Note written for work to excuse from today due to soreness of the injection site. Will update health department and follow-up RPR in 3 to 6 months.  Or if concerning for new infection. Safe sex counseling provided today.

## 2021-03-15 NOTE — Assessment & Plan Note (Signed)
Depression screen PHQ 2/9 03/14/2021  Decreased Interest 1  Down, Depressed, Hopeless 1  PHQ - 2 Score 2  Altered sleeping 0  Tired, decreased energy 0  Change in appetite 0  Feeling bad or failure about yourself  0  Trouble concentrating 2  Moving slowly or fidgety/restless 1  Suicidal thoughts 0  PHQ-9 Score 5  Difficult doing work/chores Somewhat difficult   No indication for any medications at this time.  We did discuss that if he has prolonged or experiences more severe dysphoria we can have him set up for counseling with [facility].

## 2021-03-15 NOTE — Assessment & Plan Note (Addendum)
New patient here to establish for HIV care.  Nave to treatment but ready to start.  No AIDS defining illnesses on exam and likely acquisition ~3 years ago based off his history.  We will draw pertinent lab work to stage his condition today and for therapeutic response after initiation of antiretrovirals.  I discussed with Jonelle Sports treatment options/side effects, benefits of treatment and long-term outcomes. I discussed how HIV is transmitted and the process of untreated HIV including increased risk for opportunistic infections, cancer, dementia and renal failure. Patient was counseled on routine HIV care including medication adherence, blood monitoring, necessary vaccines and follow up visits. Counseled regarding safe sex practices including: condom use, partner disclosure, limiting partners. Patient spent time talking with our pharmacist Marchelle Folks regarding successful practices of ART and understands to reach out to our clinic in the future with questions.   Will start Biktarvy for HIV treatment.  He currently has access to health insurance and will send to [employer].   General introduction to our clinic and integrated services.  Dental referral placed today for [facility]. Information to schedule appointment completed today.    We will have him return in 1 month to assess adherence, tolerability and repeat viral load.  We will also discuss routine vaccination at next office visit as well and obtain his pediatric vaccination records in the meantime. I spent greater than 45 minutes with the patient today with > 80% spent face-to-face counseling and coordination of care re: HIV and health maintenance.

## 2021-03-15 NOTE — Progress Notes (Signed)
CD4 count has resulted much lower than anticipated at only 65 cells.  AIDS + and will start on once daily single strength Bactrim for prophylaxis against toxo and PJP.  Otherwise no changes to his plan of care.

## 2021-03-23 ENCOUNTER — Other Ambulatory Visit (HOSPITAL_COMMUNITY): Payer: Self-pay

## 2021-03-28 LAB — COMPLETE METABOLIC PANEL WITH GFR
AG Ratio: 0.8 (calc) — ABNORMAL LOW (ref 1.0–2.5)
ALT: 9 U/L (ref 9–46)
AST: 16 U/L (ref 10–40)
Albumin: 3.9 g/dL (ref 3.6–5.1)
Alkaline phosphatase (APISO): 48 U/L (ref 36–130)
BUN: 12 mg/dL (ref 7–25)
CO2: 31 mmol/L (ref 20–32)
Calcium: 9.7 mg/dL (ref 8.6–10.3)
Chloride: 100 mmol/L (ref 98–110)
Creat: 0.97 mg/dL (ref 0.60–1.35)
GFR, Est African American: 130 mL/min/{1.73_m2} (ref >=60)
GFR, Est Non African American: 112 mL/min/{1.73_m2} (ref >=60)
Globulin: 4.9 g/dL (calc) — ABNORMAL HIGH (ref 1.9–3.7)
Glucose, Bld: 86 mg/dL (ref 65–99)
Potassium: 4.2 mmol/L (ref 3.5–5.3)
Sodium: 138 mmol/L (ref 135–146)
Total Bilirubin: 0.4 mg/dL (ref 0.2–1.2)
Total Protein: 8.8 g/dL — ABNORMAL HIGH (ref 6.1–8.1)

## 2021-03-28 LAB — HIV-1 RNA ULTRAQUANT REFLEX TO GENTYP+
HIV 1 RNA Quant: 214000 copies/mL — ABNORMAL HIGH
HIV-1 RNA Quant, Log: 5.33 Log copies/mL — ABNORMAL HIGH

## 2021-03-28 LAB — CBC WITH DIFFERENTIAL/PLATELET
Absolute Monocytes: 530 {cells}/uL (ref 200–950)
Basophils Absolute: 12 {cells}/uL (ref 0–200)
Basophils Relative: 0.3 %
Eosinophils Absolute: 20 {cells}/uL (ref 15–500)
Eosinophils Relative: 0.5 %
HCT: 44.1 % (ref 38.5–50.0)
Hemoglobin: 14.7 g/dL (ref 13.2–17.1)
Lymphs Abs: 663 {cells}/uL — ABNORMAL LOW (ref 850–3900)
MCH: 28.4 pg (ref 27.0–33.0)
MCHC: 33.3 g/dL (ref 32.0–36.0)
MCV: 85.1 fL (ref 80.0–100.0)
MPV: 9.7 fL (ref 7.5–12.5)
Monocytes Relative: 13.6 %
Neutro Abs: 2675 {cells}/uL (ref 1500–7800)
Neutrophils Relative %: 68.6 %
Platelets: 231 Thousand/uL (ref 140–400)
RBC: 5.18 Million/uL (ref 4.20–5.80)
RDW: 12.9 % (ref 11.0–15.0)
Total Lymphocyte: 17 %
WBC: 3.9 Thousand/uL (ref 3.8–10.8)

## 2021-03-28 LAB — RPR TITER: RPR Titer: 1:32 {titer} — ABNORMAL HIGH

## 2021-03-28 LAB — HEPATITIS B SURFACE ANTIBODY,QUALITATIVE: Hep B S Ab: NONREACTIVE

## 2021-03-28 LAB — HEPATITIS A ANTIBODY, TOTAL: Hepatitis A AB,Total: REACTIVE — AB

## 2021-03-28 LAB — RPR: RPR Ser Ql: REACTIVE — AB

## 2021-03-28 LAB — HEPATITIS B SURFACE ANTIGEN: Hepatitis B Surface Ag: NONREACTIVE

## 2021-03-28 LAB — HIV-1 GENOTYPE: HIV-1 Genotype: DETECTED — AB

## 2021-03-28 LAB — FLUORESCENT TREPONEMAL AB(FTA)-IGG-BLD: Fluorescent Treponemal ABS: REACTIVE — AB

## 2021-04-05 ENCOUNTER — Other Ambulatory Visit (HOSPITAL_COMMUNITY): Payer: Self-pay

## 2021-04-06 ENCOUNTER — Other Ambulatory Visit (HOSPITAL_COMMUNITY): Payer: Self-pay

## 2021-04-07 ENCOUNTER — Other Ambulatory Visit: Payer: Self-pay

## 2021-04-07 ENCOUNTER — Ambulatory Visit (INDEPENDENT_AMBULATORY_CARE_PROVIDER_SITE_OTHER): Payer: BC Managed Care – PPO | Admitting: Infectious Diseases

## 2021-04-07 ENCOUNTER — Encounter: Payer: Self-pay | Admitting: Infectious Diseases

## 2021-04-07 VITALS — BP 115/76 | HR 75 | Temp 98.2°F | Wt 132.0 lb

## 2021-04-07 DIAGNOSIS — B20 Human immunodeficiency virus [HIV] disease: Secondary | ICD-10-CM | POA: Diagnosis not present

## 2021-04-07 DIAGNOSIS — Z21 Asymptomatic human immunodeficiency virus [HIV] infection status: Secondary | ICD-10-CM | POA: Diagnosis not present

## 2021-04-07 DIAGNOSIS — Z8619 Personal history of other infectious and parasitic diseases: Secondary | ICD-10-CM | POA: Diagnosis not present

## 2021-04-07 NOTE — Assessment & Plan Note (Addendum)
Surprising that his CD4 was much lower than I expected at 65. He has received Bactrim from pharmacy and will start taking this one DS tab daily for prophylaxis. Spent a majority of the discussion regarding progression to AIDS with years of untreated HIV and strong likelihood that this will improve, likely back to a normal range. Anticipate Bactrim 3-6 months . Will hold off on routine vaccines until immune reconstitution.

## 2021-04-07 NOTE — Assessment & Plan Note (Deleted)
Surprising that his CD4 was much lower than I expected at 64 copies

## 2021-04-07 NOTE — Patient Instructions (Signed)
Please continue your BIKTARVY and BACTRIM every day.   Please stop by the lab on your way out to see how things are going on your medication.   Would like to see you back in 3 months - we can do labs same day for you or 1-2 weeks ahead of time if you want them available for the visit.

## 2021-04-07 NOTE — Assessment & Plan Note (Addendum)
Ronnie Marsh appears to be doing well early in his treatment. No significant side effects to medication and adherence seems to be excellent. We reviewed initial pre-treatment blood work. Answered questions to his satisfaction. Will repeat his viral load today to follow therapeutic response to treatment. Continue BIKTARVY once a day.  Return in about 3 months (around 07/08/2021).

## 2021-04-07 NOTE — Progress Notes (Signed)
Name: Ronnie Marsh  DOB: [birth year] MRN: [id number] PCP: Nickola Major, MD     Brief Narrative:  Ronnie Marsh is a 20 y.o. male with HIV Dx 03/03/2021.  CD4 64 ( VL  HIV Risk: sexual,  History of OIs: none Intake Labs 03/14/2021: Hep B sAg (-), sAb (-), cAb (-); Hep A (+), Hep C (-) Quantiferon (-) HLA B*5701 (-) G6PD: ()   Previous Regimens: Biktarvy   Genotypes: 03/14/2021 - wildtype  Subjective:   CC:  HIV follow up care.     HPI: Ronnie Marsh has been doing well the last 3-4 weeks taking his Biktarvy everyday. He did notice some lead in side effects but they have mostly resolved now and not making it difficult for him to take his medication.  He would like to discuss CD4 count and hepatitis related labs.    All rashes around peri-anus have resolved following syphilis treatment with bicillin for secondary syphilis.   Reports no complaints today suggestive of associated opportunistic infection or advancing HIV disease such as fevers, night sweats, weight loss, anorexia, cough, SOB, nausea, vomiting, diarrhea, headache, sensory changes, lymphadenopathy or oral thrush.    Depression screen PHQ 2/9 03/14/2021  Decreased Interest 1  Down, Depressed, Hopeless 1  PHQ - 2 Score 2  Altered sleeping 0  Tired, decreased energy 0  Change in appetite 0  Feeling bad or failure about yourself  0  Trouble concentrating 2  Moving slowly or fidgety/restless 1  Suicidal thoughts 0  PHQ-9 Score 5  Difficult doing work/chores Somewhat difficult    Review of Systems  Constitutional:  Negative for appetite change, chills, fatigue, fever and unexpected weight change.  Eyes:  Negative for visual disturbance.  Respiratory:  Negative for cough and shortness of breath.   Cardiovascular:  Negative for chest pain and leg swelling.  Gastrointestinal:  Negative for abdominal pain, diarrhea and nausea.  Genitourinary:  Negative for dysuria, genital sores and penile discharge.   Musculoskeletal:  Negative for joint swelling.  Skin:  Negative for color change and rash.  Neurological:  Negative for dizziness and headaches.  Hematological:  Negative for adenopathy.  Psychiatric/Behavioral:  Negative for sleep disturbance. The patient is not nervous/anxious.     Past Medical History:  Diagnosis Date   Asymptomatic HIV infection, with no history of HIV-related illness ([place]) 03/15/2021   Syphilis 03/15/2021    Outpatient Medications Prior to Visit  Medication Sig Dispense Refill   bictegravir-emtricitabine-tenofovir AF (BIKTARVY) 50-200-25 MG TABS tablet Take 1 tablet by mouth daily. 30 tablet 5   sulfamethoxazole-trimethoprim (BACTRIM) 400-80 MG tablet Take 1 tablet by mouth daily. 30 tablet 4   traMADol (ULTRAM) 50 MG tablet Take 1 tablet (50 mg total) by mouth every 6 (six) hours as needed. (Patient not taking: No sig reported) 20 tablet 0   No facility-administered medications prior to visit.     Allergies  Allergen Reactions   Pineapple Itching    Raw pineapple makes his tongue/mouth itch    Social History   Tobacco Use   Smoking status: Some Days   Smokeless tobacco: Never  Vaping Use   Vaping Use: Never used  Substance Use Topics   Alcohol use: Yes    Comment: occ   Drug use: Not Currently    Types: Marijuana    No family history on file.  Social History   Substance and Sexual Activity  Sexual Activity Not Currently   Partners: Male   Comment: agreed condoms  Objective:   Vitals:   04/07/21 1100  BP: 115/76  Pulse: 75  Temp: 98.2 F (36.8 C)  TempSrc: Oral  SpO2: 100%  Weight: 132 lb (59.9 kg)   Body mass index is 20.07 kg/m.  Physical Exam HENT:     Mouth/Throat:     Mouth: No oral lesions.     Dentition: Normal dentition. No dental caries.  Eyes:     General: No scleral icterus. Cardiovascular:     Rate and Rhythm: Normal rate and regular rhythm.     Heart sounds: Normal heart sounds.  Pulmonary:     Effort:  Pulmonary effort is normal.     Breath sounds: Normal breath sounds.  Abdominal:     General: There is no distension.     Palpations: Abdomen is soft.     Tenderness: There is no abdominal tenderness.  Lymphadenopathy:     Cervical: No cervical adenopathy.  Skin:    General: Skin is warm and dry.     Findings: No rash.  Neurological:     Mental Status: He is alert and oriented to person, place, and time.    Lab Results Lab Results  Component Value Date   WBC 3.9 03/14/2021   HGB 14.7 03/14/2021   HCT 44.1 03/14/2021   MCV 85.1 03/14/2021   PLT 231 03/14/2021    Lab Results  Component Value Date   CREATININE 0.97 03/14/2021   BUN 12 03/14/2021   NA 138 03/14/2021   K 4.2 03/14/2021   CL 100 03/14/2021   CO2 31 03/14/2021    Lab Results  Component Value Date   ALT 9 03/14/2021   AST 16 03/14/2021   ALKPHOS 54 06/10/2019   BILITOT 0.4 03/14/2021    No results found for: CHOL, HDL, LDLCALC, LDLDIRECT, TRIG, CHOLHDL HIV 1 RNA Quant (copies/mL)  Date Value  03/14/2021 214,000 (H)   CD4 T Cell Abs (/uL)  Date Value  03/14/2021 65 (L)     Assessment & Plan:   Problem List Items Addressed This Visit       Unprioritized   History of syphilis    Condyloma lata resolved - will follow post treatment titer in 3 months for therapeutic response.        Asymptomatic HIV infection, with no history of HIV-related illness ([place]) - Primary    Ronnie Marsh appears to be doing well early in his treatment. No significant side effects to medication and adherence seems to be excellent. We reviewed initial pre-treatment blood work. Answered questions to his satisfaction. Will repeat his viral load today to follow therapeutic response to treatment. Continue BIKTARVY once a day.  Return in about 3 months (around 07/08/2021).        Relevant Orders   HIV-1 RNA quant-no reflex-bld   T-helper cells (CD4) count   AIDS (acquired immune deficiency syndrome) ([place])    Surprising that his  CD4 was much lower than I expected at 67. He has received Bactrim from pharmacy and will start taking this one DS tab daily for prophylaxis. Spent a majority of the discussion regarding progression to AIDS with years of untreated HIV and strong likelihood that this will improve, likely back to a normal range. Anticipate Bactrim 3-6 months . Will hold off on routine vaccines until immune reconstitution.        Janene Madeira, MSN, NP-C [facility] for Infectious [place] Pager: [phone] Office: [phone]  04/07/21  8:17 PM

## 2021-04-07 NOTE — Assessment & Plan Note (Signed)
Condyloma lata resolved - will follow post treatment titer in 3 months for therapeutic response.

## 2021-04-10 LAB — HIV-1 RNA QUANT-NO REFLEX-BLD
HIV 1 RNA Quant: 125 Copies/mL — ABNORMAL HIGH
HIV-1 RNA Quant, Log: 2.1 Log cps/mL — ABNORMAL HIGH

## 2021-04-10 LAB — T-HELPER CELLS (CD4) COUNT (NOT AT ARMC)
Absolute CD4: 147 cells/uL — ABNORMAL LOW (ref 490–1740)
CD4 T Helper %: 14 % — ABNORMAL LOW (ref 30–61)
Total lymphocyte count: 1071 cells/uL (ref 850–3900)

## 2021-04-13 ENCOUNTER — Encounter: Payer: Self-pay | Admitting: Infectious Diseases

## 2021-05-05 ENCOUNTER — Other Ambulatory Visit (HOSPITAL_COMMUNITY): Payer: Self-pay

## 2021-05-30 ENCOUNTER — Other Ambulatory Visit (HOSPITAL_COMMUNITY): Payer: Self-pay

## 2021-05-31 ENCOUNTER — Other Ambulatory Visit (HOSPITAL_COMMUNITY): Payer: Self-pay

## 2021-06-13 DIAGNOSIS — A630 Anogenital (venereal) warts: Secondary | ICD-10-CM | POA: Insufficient documentation

## 2021-06-22 ENCOUNTER — Other Ambulatory Visit (HOSPITAL_COMMUNITY): Payer: Self-pay

## 2021-07-03 ENCOUNTER — Encounter: Payer: Self-pay | Admitting: Infectious Diseases

## 2021-07-03 ENCOUNTER — Telehealth (INDEPENDENT_AMBULATORY_CARE_PROVIDER_SITE_OTHER): Payer: BC Managed Care – PPO | Admitting: Infectious Diseases

## 2021-07-03 ENCOUNTER — Other Ambulatory Visit: Payer: Self-pay

## 2021-07-03 DIAGNOSIS — A630 Anogenital (venereal) warts: Secondary | ICD-10-CM | POA: Diagnosis not present

## 2021-07-03 DIAGNOSIS — Z21 Asymptomatic human immunodeficiency virus [HIV] infection status: Secondary | ICD-10-CM

## 2021-07-03 DIAGNOSIS — Z8619 Personal history of other infectious and parasitic diseases: Secondary | ICD-10-CM

## 2021-07-03 DIAGNOSIS — B20 Human immunodeficiency virus [HIV] disease: Secondary | ICD-10-CM

## 2021-07-03 NOTE — Progress Notes (Signed)
Name: Ronnie Marsh  DOB: 2001/03/17 MRN: [id number] PCP: Nickola Major, MD     Brief Narrative:  Ronnie Marsh is a 20 y.o. male with HIV Dx 03/03/2021.  CD4 64 ( VL  HIV Risk: sexual,  History of OIs: none Intake Labs 03/14/2021: Hep B sAg (-), sAb (-), cAb (-); Hep A (+), Hep C (-) Quantiferon (-) HLA B*5701 (-) G6PD: ()   Previous Regimens: Biktarvy   Genotypes: 03/14/2021 - wildtype  Subjective:   CC:  HIV follow up care.      HPI: Only thing concerning are skin tags and warts on his bottom. 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 Has noticed some new lesions coming up that are smaller.   Doing well on Biktarvy and tries to remember it everyday. He is still taking Bactrim and wonders if he can take at the same time as Biktarvy. Has noticed some fogginess/disorientation sometimes if he takes a nap after he doses the biktarvy. But overall has noticed some good physical changes and recovery since being on the medication.  Not currently sexually active.  Interested in recommended vaccines but prefers on the weekend so he can rest if he were to have s/e.     Depression screen PHQ 2/9 07/03/2021  Decreased Interest 0  Down, Depressed, Hopeless 0  PHQ - 2 Score 0  Altered sleeping -  Tired, decreased energy -  Change in appetite -  Feeling bad or failure about yourself  -  Trouble concentrating -  Moving slowly or fidgety/restless -  Suicidal thoughts -  PHQ-9 Score -  Difficult doing work/chores -    Review of Systems  Constitutional:  Negative for appetite change, chills, fatigue, fever and unexpected weight change.  Eyes:  Negative for visual disturbance.  Respiratory:  Negative for cough and shortness of breath.   Cardiovascular:  Negative for chest pain and leg swelling.  Gastrointestinal:  Negative for abdominal pain, diarrhea and nausea.  Genitourinary:  Negative for dysuria, genital sores and penile  discharge.  Musculoskeletal:  Negative for joint swelling.  Skin:  Negative for color change and rash.  Neurological:  Negative for dizziness and headaches.  Hematological:  Negative for adenopathy.  Psychiatric/Behavioral:  Negative for sleep disturbance. The patient is not nervous/anxious.     Past Medical History:  Diagnosis Date   Asymptomatic HIV infection, with no history of HIV-related illness ([place]) 03/15/2021   Syphilis 03/15/2021    Outpatient Medications Prior to Visit  Medication Sig Dispense Refill   bictegravir-emtricitabine-tenofovir AF (BIKTARVY) 50-200-25 MG TABS tablet Take 1 tablet by mouth daily. 30 tablet 5   sulfamethoxazole-trimethoprim (BACTRIM) 400-80 MG tablet Take 1 tablet by mouth daily. 30 tablet 4   No facility-administered medications prior to visit.     Allergies  Allergen Reactions   Pineapple Itching    Raw pineapple makes his tongue/mouth itch    Social History   Tobacco Use   Smoking status: Some Days   Smokeless tobacco: Never  Vaping Use   Vaping Use: Never used  Substance Use Topics   Alcohol use: Yes    Comment: occ   Drug use: Not Currently    Types: Marijuana    No family history on file.  Social History   Substance and Sexual Activity  Sexual Activity Not Currently   Partners: Male   Comment: agreed condoms     Objective:   Vitals:   07/03/21 1000  BP: (!) 132/56  Pulse: 76  Temp: 98.5 F (36.9 C)  TempSrc: Oral  Weight: 137 lb (62.1 kg)   Body mass index is 20.83 kg/m.  Physical Exam Constitutional:      Appearance: Normal appearance. He is not ill-appearing.  HENT:     Head: Normocephalic.     Mouth/Throat:     Mouth: Mucous membranes are moist.     Pharynx: Oropharynx is clear.  Eyes:     General: No scleral icterus. Pulmonary:     Effort: Pulmonary effort is normal.  Musculoskeletal:        General: Normal range of motion.     Cervical back: Normal range of motion.  Skin:    Coloration: Skin  is not jaundiced or pale.  Neurological:     Mental Status: He is alert and oriented to person, place, and time.  Psychiatric:        Mood and Affect: Mood normal.        Judgment: Judgment normal.    Lab Results Lab Results  Component Value Date   WBC 3.9 03/14/2021   HGB 14.7 03/14/2021   HCT 44.1 03/14/2021   MCV 85.1 03/14/2021   PLT 231 03/14/2021    Lab Results  Component Value Date   CREATININE 0.97 03/14/2021   BUN 12 03/14/2021   NA 138 03/14/2021   K 4.2 03/14/2021   CL 100 03/14/2021   CO2 31 03/14/2021    Lab Results  Component Value Date   ALT 9 03/14/2021   AST 16 03/14/2021   ALKPHOS 54 06/10/2019   BILITOT 0.4 03/14/2021    No results found for: CHOL, HDL, LDLCALC, LDLDIRECT, TRIG, CHOLHDL HIV 1 RNA Quant  Date Value  04/07/2021 125 Copies/mL (H)  03/14/2021 214,000 copies/mL (H)   CD4 T Cell Abs (/uL)  Date Value  03/14/2021 65 (L)     Assessment & Plan:   Problem List Items Addressed This Visit       Unprioritized   History of syphilis    Repeat RPR today for therapeutic monitoring. Previous symptoms resolved with treatment. No new exposures.       Asymptomatic HIV infection, with no history of HIV-related illness ([place])    Doing well on biktarvy once daily - discussed possible trial of taking this at night to help with foggy-headed side effect if this is something he experiences often. Otherwise doing well and had a good response on treatment after 1 month. Should be undetectable now - will update VL, CD4 and CMP today.  Flu and Prevnar to be scheduled for injection clinic  Politely declined STI screen today. Sexual health briefly discussed  RTC in 64mfor routine care.       Relevant Orders   COMPLETE METABOLIC PANEL WITH GFR   HIV 1 RNA quant-no reflex-bld   T-helper cell (CD4)- ([facility] clinic only)   RPR   Anal condyloma    Plan for excision 07-19-21.       AIDS (acquired immune deficiency syndrome) ([place]    Likely low CD4 in  the setting of more recent infection - I am hopeful he will be improved significantly today. Will plan to continue bactrim for another 3 months then stop pending today's CD4 result.  Overall appears to be improving.      SJanene Madeira MSN, NP-C [facility]endfor Infectious DEssex FellsPager: [phone]: [phone] 07/03/21  10:25 AM

## 2021-07-03 NOTE — Assessment & Plan Note (Signed)
Plan for excision 07-19-21.

## 2021-07-03 NOTE — Assessment & Plan Note (Signed)
Likely low CD4 in the setting of more recent infection - I am hopeful he will be improved significantly today. Will plan to continue bactrim for another 3 months then stop pending today's CD4 result.  Overall appears to be improving.

## 2021-07-03 NOTE — Patient Instructions (Addendum)
You look great! Please continue taking your Biktarvy and bactrim everyday - can try re-timing them to the evening to see if that helps your foggy side effects.   Stop by the lab on your way out - will update you via mychart when results are here  Schedule a vaccine visit at your ability for the flu shot and your first pneumonia shot (Prevnar)   For your next appointment we can see you back in 3 months.  Can do labs same day for you.

## 2021-07-03 NOTE — Assessment & Plan Note (Signed)
Doing well on biktarvy once daily - discussed possible trial of taking this at night to help with foggy-headed side effect if this is something he experiences often. Otherwise doing well and had a good response on treatment after 1 month. Should be undetectable now - will update VL, CD4 and CMP today.  Flu and Prevnar to be scheduled for injection clinic  Politely declined STI screen today. Sexual health briefly discussed  RTC in 46m for routine care.

## 2021-07-03 NOTE — Assessment & Plan Note (Signed)
Repeat RPR today for therapeutic monitoring. Previous symptoms resolved with treatment. No new exposures.

## 2021-07-04 LAB — T-HELPER CELL (CD4) - (RCID CLINIC ONLY)
CD4 % Helper T Cell: 18 % — ABNORMAL LOW (ref 33–65)
CD4 T Cell Abs: 142 /uL — ABNORMAL LOW (ref 400–1790)

## 2021-07-05 LAB — HIV-1 RNA QUANT-NO REFLEX-BLD
HIV 1 RNA Quant: 31 Copies/mL — ABNORMAL HIGH
HIV-1 RNA Quant, Log: 1.49 Log cps/mL — ABNORMAL HIGH

## 2021-07-05 LAB — COMPLETE METABOLIC PANEL WITH GFR
AG Ratio: 1.2 (calc) (ref 1.0–2.5)
ALT: 11 U/L (ref 9–46)
AST: 18 U/L (ref 10–40)
Albumin: 4.2 g/dL (ref 3.6–5.1)
Alkaline phosphatase (APISO): 53 U/L (ref 36–130)
BUN: 14 mg/dL (ref 7–25)
CO2: 28 mmol/L (ref 20–32)
Calcium: 9.5 mg/dL (ref 8.6–10.3)
Chloride: 99 mmol/L (ref 98–110)
Creat: 1.2 mg/dL (ref 0.60–1.24)
Globulin: 3.5 g/dL (calc) (ref 1.9–3.7)
Glucose, Bld: 125 mg/dL — ABNORMAL HIGH (ref 65–99)
Potassium: 4 mmol/L (ref 3.5–5.3)
Sodium: 138 mmol/L (ref 135–146)
Total Bilirubin: 0.6 mg/dL (ref 0.2–1.2)
Total Protein: 7.7 g/dL (ref 6.1–8.1)
eGFR: 88 mL/min/{1.73_m2} (ref >=60)

## 2021-07-05 LAB — RPR TITER: RPR Titer: 1:8 {titer} — ABNORMAL HIGH

## 2021-07-05 LAB — FLUORESCENT TREPONEMAL AB(FTA)-IGG-BLD: Fluorescent Treponemal ABS: REACTIVE — AB

## 2021-07-05 LAB — RPR: RPR Ser Ql: REACTIVE — AB

## 2021-07-07 ENCOUNTER — Other Ambulatory Visit (HOSPITAL_COMMUNITY): Payer: Self-pay

## 2021-07-31 ENCOUNTER — Other Ambulatory Visit (HOSPITAL_COMMUNITY): Payer: Self-pay

## 2021-08-02 ENCOUNTER — Other Ambulatory Visit (HOSPITAL_COMMUNITY): Payer: Self-pay

## 2021-08-04 ENCOUNTER — Other Ambulatory Visit (HOSPITAL_COMMUNITY): Payer: Self-pay

## 2021-08-11 ENCOUNTER — Other Ambulatory Visit (HOSPITAL_COMMUNITY): Payer: Self-pay

## 2021-08-31 ENCOUNTER — Other Ambulatory Visit (HOSPITAL_COMMUNITY): Payer: Self-pay

## 2021-09-08 ENCOUNTER — Other Ambulatory Visit (HOSPITAL_COMMUNITY): Payer: Self-pay

## 2021-09-25 ENCOUNTER — Encounter: Payer: Self-pay | Admitting: Infectious Diseases

## 2021-09-25 ENCOUNTER — Ambulatory Visit (INDEPENDENT_AMBULATORY_CARE_PROVIDER_SITE_OTHER): Payer: 59 | Admitting: Infectious Diseases

## 2021-09-25 ENCOUNTER — Other Ambulatory Visit (HOSPITAL_COMMUNITY): Payer: Self-pay

## 2021-09-25 ENCOUNTER — Other Ambulatory Visit (HOSPITAL_COMMUNITY)
Admission: RE | Admit: 2021-09-25 | Discharge: 2021-09-25 | Disposition: A | Discharge status: Home / Self Care | Payer: 59 | Source: Ambulatory Visit | Attending: Infectious Diseases | Admitting: Infectious Diseases

## 2021-09-25 ENCOUNTER — Other Ambulatory Visit: Payer: Self-pay

## 2021-09-25 VITALS — BP 120/77 | HR 59 | Temp 97.5°F | Ht 70.0 in | Wt 135.0 lb

## 2021-09-25 DIAGNOSIS — N50812 Left testicular pain: Secondary | ICD-10-CM | POA: Insufficient documentation

## 2021-09-25 DIAGNOSIS — A630 Anogenital (venereal) warts: Secondary | ICD-10-CM

## 2021-09-25 DIAGNOSIS — Z23 Encounter for immunization: Secondary | ICD-10-CM | POA: Diagnosis not present

## 2021-09-25 DIAGNOSIS — B20 Human immunodeficiency virus [HIV] disease: Secondary | ICD-10-CM | POA: Diagnosis not present

## 2021-09-25 DIAGNOSIS — Z21 Asymptomatic human immunodeficiency virus [HIV] infection status: Secondary | ICD-10-CM

## 2021-09-25 DIAGNOSIS — Z8619 Personal history of other infectious and parasitic diseases: Secondary | ICD-10-CM | POA: Diagnosis not present

## 2021-09-25 MED ORDER — SULFAMETHOXAZOLE-TRIMETHOPRIM 400-80 MG PO TABS
1.0000 | ORAL_TABLET | Freq: Every day | ORAL | 3 refills | Status: DC
Start: 2021-09-25 — End: 2022-08-23
  Filled 2021-09-25 – 2021-11-06 (×2): qty 30, 30d supply, fill #0
  Filled 2021-12-13 – 2022-01-16 (×3): qty 30, 30d supply, fill #1

## 2021-09-25 MED ORDER — BIKTARVY 50-200-25 MG PO TABS
1.0000 | ORAL_TABLET | Freq: Every day | ORAL | 11 refills | Status: DC
  Filled 2021-09-25 – 2021-11-06 (×2): qty 30, 30d supply, fill #0
  Filled 2021-12-13 – 2021-12-14 (×2): qty 30, 30d supply, fill #1
  Filled 2022-01-16: qty 30, 30d supply, fill #2
  Filled 2022-05-18 – 2022-08-05 (×2): qty 30, 30d supply, fill #3

## 2021-09-25 NOTE — Assessment & Plan Note (Signed)
Exam benign today. Will check urine and rectal gonorrhea / chlamydia. If all normal he has some features that may be c/w chronic prostatitis; will probably increase bactrim dose for treatment x 3 weeks to see if he improves. No indication for U/S today.

## 2021-09-25 NOTE — Patient Instructions (Addendum)
Will have you give [place] a urine sample and bottom swab today.   Please stop by the lab on your way out.   Will let you know what your labs look like via mychart portal.   Continue bactrim and biktarvy everyday like you have been.   We gave you your first hepatitis b shot today - will get your next and final dose in 1 month (or we can do at your next appointment if you prefer).

## 2021-09-25 NOTE — Assessment & Plan Note (Signed)
FU RPR today for therapeutic monitoring. No new infection / exposure concern.

## 2021-09-25 NOTE — Progress Notes (Signed)
Name: Ronnie Marsh  DOB: 10/07/2000 MRN: [id number] PCP: Ronnie Found, MD     Brief Narrative:  Ronnie Marsh is a 21 y.o. male with HIV Dx 03/03/2021.  CD4 64 ( VL  HIV Risk: sexual,  History of OIs: none Intake Labs 03/14/2021: Hep B sAg (-), sAb (-), cAb (-); Hep A (+), Hep C (-) Quantiferon (-) HLA B*5701 (-) G6PD: ()   Previous Regimens: Biktarvy   Genotypes: 03/14/2021 - wildtype  Subjective:   CC:  HIV follow up care.      HPI: Ronnie Marsh is here for routine follow up care. Has noticed some nausea with the biktarvy / bactrim combo but he has gotten used to this and no longer experiences the side effects anymore. Has noticed he is sleeping better overall and restful. Weight is stable.   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 Healed up nicely and has one more follow up with the surgeon soon.  Main concern today is timing of biktarvy with alcohol - worried there may be a risk of interaction and has missed a few doses in the past when he had a few days of increased etoh use. Does not happen often.   He has noticed some dull pain to the left testicle that started around Thanksgiving time. Painful sometimes over the testicle itself. No dysuria noted or rectal discharge. When he is bottom partner he does have some newer quality pain at times. No genital sex the last month or so. No pain with ejaculation or swelling/heaviness in the perineal area. Occasional discomfort with passing BM.    Depression screen PHQ 2/9 09/25/2021  Decreased Interest 0  Down, Depressed, Hopeless 1  PHQ - 2 Score 1  Altered sleeping -  Tired, decreased energy -  Change in appetite -  Feeling bad or failure about yourself  -  Trouble concentrating -  Moving slowly or fidgety/restless -  Suicidal thoughts -  PHQ-9 Score -  Difficult doing work/chores -    Review of Systems  Constitutional:  Negative for appetite change, chills, fatigue, fever and unexpected weight change.   Eyes:  Negative for visual disturbance.  Respiratory:  Negative for cough and shortness of breath.   Cardiovascular:  Negative for chest pain and leg swelling.  Gastrointestinal:  Negative for abdominal pain, diarrhea and nausea.  Genitourinary:  Positive for testicular pain. Negative for dysuria, frequency, genital sores, penile discharge, penile swelling and scrotal swelling.  Musculoskeletal:  Negative for joint swelling.  Skin:  Negative for color change and rash.  Neurological:  Negative for dizziness and headaches.  Hematological:  Negative for adenopathy.  Psychiatric/Behavioral:  Negative for sleep disturbance. The patient is not nervous/anxious.     Past Medical History:  Diagnosis Date   Asymptomatic HIV infection, with no history of HIV-related illness ([facility]) 03/15/2021   Syphilis 03/15/2021    Outpatient Medications Prior to Visit  Medication Sig Dispense Refill   bictegravir-emtricitabine-tenofovir AF (BIKTARVY) 50-200-25 MG TABS tablet Take 1 tablet by mouth daily. 30 tablet 5   sulfamethoxazole-trimethoprim (BACTRIM) 400-80 MG tablet Take 1 tablet by mouth daily. 30 tablet 4   No facility-administered medications prior to visit.     Allergies  Allergen Reactions   Pineapple Itching    Raw pineapple makes his tongue/mouth itch    Social History   Tobacco Use   Smoking status: Former    Types: Cigarettes   Smokeless tobacco: Never  Vaping Use   Vaping Use: Never used  Substance Use Topics   Alcohol use: Yes    Comment: socially   Drug use: Yes    Types: Marijuana    Comment: socially     Social History   Substance and Sexual Activity  Sexual Activity Not Currently   Partners: Male   Comment: agreed condoms     Objective:   Vitals:   09/25/21 1014  BP: 120/77  Pulse: (!) 59  Temp: (!) 97.5 F (36.4 C)  TempSrc: Oral  SpO2: 100%  Weight: 135 lb (61.2 kg)  Height: _0  (1.778 m)   Body mass index is 19.37 kg/m.  Physical  Exam Constitutional:      Appearance: Normal appearance. He is not ill-appearing.  HENT:     Head: Normocephalic.     Mouth/Throat:     Mouth: Mucous membranes are moist.     Pharynx: Oropharynx is clear.  Eyes:     General: No scleral icterus. Cardiovascular:     Rate and Rhythm: Normal rate.  Pulmonary:     Effort: Pulmonary effort is normal.  Genitourinary:    Penis: Normal.      Comments: No tenderness palpating testicular cords. No swelling or erythema appreciated to skin of teste. No tenderness to teste today with palpation. +cremasteric reflex intact.  Musculoskeletal:        General: Normal range of motion.     Cervical back: Normal range of motion.  Skin:    Coloration: Skin is not jaundiced or pale.  Neurological:     Mental Status: He is alert and oriented to person, place, and time.  Psychiatric:        Mood and Affect: Mood normal.        Judgment: Judgment normal.    Lab Results Lab Results  Component Value Date   WBC 3.9 03/14/2021   HGB 14.7 03/14/2021   HCT 44.1 03/14/2021   MCV 85.1 03/14/2021   PLT 231 03/14/2021    Lab Results  Component Value Date   CREATININE 1.20 07/03/2021   BUN 14 07/03/2021   NA 138 07/03/2021   K 4.0 07/03/2021   CL 99 07/03/2021   CO2 28 07/03/2021    Lab Results  Component Value Date   ALT 11 07/03/2021   AST 18 07/03/2021   ALKPHOS 54 06/10/2019   BILITOT 0.6 07/03/2021    No results Marsh for: CHOL, HDL, LDLCALC, LDLDIRECT, TRIG, CHOLHDL HIV 1 RNA Quant  Date Value  07/03/2021 31 Copies/mL (H)  04/07/2021 125 Copies/mL (H)  03/14/2021 214,000 copies/mL (H)   CD4 T Cell Abs (/uL)  Date Value  07/03/2021 142 (L)  03/14/2021 65 (L)     Assessment & Plan:   Problem List Items Addressed This Visit       Unprioritized   Asymptomatic HIV infection, with no history of HIV-related illness ([place])    Doing well with biktarvy. Talked about timing of mediations with rare episodes of excessive etoh use. We  reviewed historical labs and last VL undetectably low at 32 copies. Will refill Biktarvy today. Can provide copay card if/when needed.  Hep B vaccine #1 today - 2nd dose in 4 weeks. Will do Prevnar 20 at follow up. Politely declined flu shot today.       Relevant Medications   bictegravir-emtricitabine-tenofovir AF (BIKTARVY) 50-200-25 MG TABS tablet   sulfamethoxazole-trimethoprim (BACTRIM) 400-80 MG tablet   Other Relevant Orders   T-helper cells (CD4) count   HIV 1 RNA quant-no reflex-bld  History of syphilis    FU RPR today for therapeutic monitoring. No new infection / exposure concern.       Relevant Orders   RPR   AIDS (acquired immune deficiency syndrome) ([place]) - Primary    Repeat CD4 today - up to 140 last check.  Continue bactrim for prophylaxis. Hopefully we can stop soon.       Relevant Medications   bictegravir-emtricitabine-tenofovir AF (BIKTARVY) 50-200-25 MG TABS tablet   sulfamethoxazole-trimethoprim (BACTRIM) 400-80 MG tablet   Anal condyloma    Appreciate surgery team's help. Now S/P excision. Will talk with him about anal cytology annually for screening.       Relevant Medications   bictegravir-emtricitabine-tenofovir AF (BIKTARVY) 50-200-25 MG TABS tablet   sulfamethoxazole-trimethoprim (BACTRIM) 400-80 MG tablet   Testicular pain, left    Exam benign today. Will check urine and rectal gonorrhea / chlamydia. If all normal he has some features that may be c/w chronic prostatitis; will probably increase bactrim dose for treatment x 3 weeks to see if he improves. No indication for U/S today.       Relevant Orders   Urine cytology ancillary only   Cytology (oral, anal, urethral) ancillary only   Other Visit Diagnoses     Need for hepatitis B vaccination       Relevant Orders   He[place]isav-B (HepB-CPG) Vaccine (Completed)      Janene Madeira, MSN, NP-C [place] [facility] [place] Pager: [phone] Office:  [phone]  09/25/21  10:51 AM

## 2021-09-25 NOTE — Assessment & Plan Note (Signed)
Doing well with biktarvy. Talked about timing of mediations with rare episodes of excessive etoh use. We reviewed historical labs and last VL undetectably low at 32 copies. Will refill Biktarvy today. Can provide copay card if/when needed.  Hep B vaccine #1 today - 2nd dose in 4 weeks. Will do Prevnar 20 at follow up. Politely declined flu shot today.

## 2021-09-25 NOTE — Assessment & Plan Note (Signed)
Repeat CD4 today - up to 140 last check.  Continue bactrim for prophylaxis. Hopefully we can stop soon.

## 2021-09-25 NOTE — Assessment & Plan Note (Addendum)
Appreciate surgery team's help. Now S/P excision. Will talk with him about anal cytology annually for screening.

## 2021-09-26 LAB — URINE CYTOLOGY ANCILLARY ONLY
Chlamydia: NEGATIVE
Comment: NEGATIVE
Comment: NORMAL
Neisseria Gonorrhea: NEGATIVE

## 2021-09-26 LAB — T-HELPER CELLS (CD4) COUNT (NOT AT ARMC)
CD4 % Helper T Cell: 20 % — ABNORMAL LOW (ref 33–65)
CD4 T Cell Abs: 186 /uL — ABNORMAL LOW (ref 400–1790)

## 2021-09-26 LAB — CYTOLOGY, (ORAL, ANAL, URETHRAL) ANCILLARY ONLY
Chlamydia: NEGATIVE
Comment: NEGATIVE
Comment: NORMAL
Neisseria Gonorrhea: NEGATIVE

## 2021-09-27 LAB — HIV-1 RNA QUANT-NO REFLEX-BLD
HIV 1 RNA Quant: 85 Copies/mL — ABNORMAL HIGH
HIV-1 RNA Quant, Log: 1.93 Log cps/mL — ABNORMAL HIGH

## 2021-09-27 LAB — FLUORESCENT TREPONEMAL AB(FTA)-IGG-BLD: Fluorescent Treponemal ABS: REACTIVE — AB

## 2021-09-27 LAB — RPR TITER: RPR Titer: 1:4 {titer} — ABNORMAL HIGH

## 2021-09-27 LAB — RPR: RPR Ser Ql: REACTIVE — AB

## 2021-10-04 ENCOUNTER — Other Ambulatory Visit (HOSPITAL_COMMUNITY): Payer: Self-pay

## 2021-11-06 ENCOUNTER — Other Ambulatory Visit (HOSPITAL_COMMUNITY): Payer: Self-pay

## 2021-11-30 ENCOUNTER — Other Ambulatory Visit (HOSPITAL_COMMUNITY): Payer: Self-pay

## 2021-12-13 ENCOUNTER — Other Ambulatory Visit (HOSPITAL_COMMUNITY): Payer: Self-pay

## 2021-12-14 ENCOUNTER — Telehealth: Payer: Self-pay

## 2021-12-14 ENCOUNTER — Other Ambulatory Visit (HOSPITAL_COMMUNITY): Payer: Self-pay

## 2021-12-14 NOTE — Telephone Encounter (Signed)
[facility] Patient Advocate Encounter ? ?Completed and sent [employer] Advancing Access application for Biktarvy for this patient who is uninsured.   ? ?Patient is approved 12/13/21 through 12/13/22. ? ?BIN      [id number] ?PCN    [id number] ?GRP    [id number] ?ID        [id number] ? ? ?Clearance Coots, CPhT ?Specialty Pharmacy Patient Advocate ?[facility] ?Phone: [phone] ?Fax:  [phone]  ?

## 2021-12-18 ENCOUNTER — Telehealth: Payer: Self-pay

## 2021-12-18 ENCOUNTER — Ambulatory Visit: Payer: 59 | Admitting: Infectious Diseases

## 2021-12-18 NOTE — Telephone Encounter (Signed)
Called patient to reschedule missed appointment with Judeth Cornfield this morning. No answer. Unable to leave a message as voicemailbox was full.  ? ?Wyvonne Lenz, RN  ?

## 2022-01-02 ENCOUNTER — Other Ambulatory Visit (HOSPITAL_COMMUNITY): Payer: Self-pay

## 2022-01-04 ENCOUNTER — Other Ambulatory Visit (HOSPITAL_COMMUNITY): Payer: Self-pay

## 2022-01-08 ENCOUNTER — Other Ambulatory Visit (HOSPITAL_COMMUNITY): Payer: Self-pay

## 2022-01-16 ENCOUNTER — Other Ambulatory Visit (HOSPITAL_COMMUNITY): Payer: Self-pay

## 2022-02-09 ENCOUNTER — Other Ambulatory Visit (HOSPITAL_COMMUNITY): Payer: Self-pay

## 2022-03-20 ENCOUNTER — Telehealth: Payer: Self-pay | Admitting: Pharmacist

## 2022-03-20 NOTE — Telephone Encounter (Signed)
[facility] and clinic pharmacy staff have been trying to contact patient for refills of Biktarvy and Bactrim with no luck.  Patient last filled Biktarvy on 01/16/22.   Ronnie Marsh L. Yumiko Alkins, PharmD [facility] Clinical Pharmacist Practitioner

## 2022-05-19 ENCOUNTER — Other Ambulatory Visit (HOSPITAL_COMMUNITY): Payer: Self-pay

## 2022-05-21 ENCOUNTER — Other Ambulatory Visit (HOSPITAL_COMMUNITY): Payer: Self-pay

## 2022-06-18 ENCOUNTER — Other Ambulatory Visit (HOSPITAL_COMMUNITY): Payer: Self-pay

## 2022-08-06 ENCOUNTER — Other Ambulatory Visit (HOSPITAL_COMMUNITY): Payer: Self-pay

## 2022-08-07 ENCOUNTER — Other Ambulatory Visit (HOSPITAL_COMMUNITY): Payer: Self-pay

## 2022-08-23 ENCOUNTER — Encounter: Payer: Self-pay | Admitting: Infectious Diseases

## 2022-08-23 ENCOUNTER — Other Ambulatory Visit (HOSPITAL_COMMUNITY): Payer: Self-pay

## 2022-08-23 ENCOUNTER — Other Ambulatory Visit: Payer: Self-pay

## 2022-08-23 ENCOUNTER — Ambulatory Visit (INDEPENDENT_AMBULATORY_CARE_PROVIDER_SITE_OTHER): Payer: 59 | Admitting: Infectious Diseases

## 2022-08-23 ENCOUNTER — Other Ambulatory Visit (HOSPITAL_COMMUNITY)
Admission: RE | Admit: 2022-08-23 | Discharge: 2022-08-23 | Disposition: A | Discharge status: Home / Self Care | Payer: 59 | Source: Ambulatory Visit | Attending: Infectious Diseases | Admitting: Infectious Diseases

## 2022-08-23 VITALS — BP 146/86 | HR 68 | Resp 16 | Ht 70.0 in | Wt 142.0 lb

## 2022-08-23 DIAGNOSIS — R198 Other specified symptoms and signs involving the digestive system and abdomen: Secondary | ICD-10-CM | POA: Insufficient documentation

## 2022-08-23 DIAGNOSIS — Z21 Asymptomatic human immunodeficiency virus [HIV] infection status: Secondary | ICD-10-CM

## 2022-08-23 DIAGNOSIS — Z8619 Personal history of other infectious and parasitic diseases: Secondary | ICD-10-CM

## 2022-08-23 DIAGNOSIS — B20 Human immunodeficiency virus [HIV] disease: Secondary | ICD-10-CM | POA: Diagnosis not present

## 2022-08-23 MED ORDER — CEFTRIAXONE SODIUM 500 MG IJ SOLR
500.0000 mg | Freq: Once | INTRAMUSCULAR | Status: AC
  Administered 2022-08-23: 500 mg via INTRAMUSCULAR

## 2022-08-23 MED ORDER — SULFAMETHOXAZOLE-TRIMETHOPRIM 400-80 MG PO TABS
1.0000 | ORAL_TABLET | Freq: Every day | ORAL | 3 refills | Status: DC
  Filled 2022-08-23 – 2022-08-27 (×3): qty 30, 30d supply, fill #0

## 2022-08-23 MED ORDER — BIKTARVY 50-200-25 MG PO TABS
1.0000 | ORAL_TABLET | Freq: Every day | ORAL | 11 refills | Status: DC
  Filled 2022-08-23 – 2022-09-06 (×2): qty 30, 30d supply, fill #0
  Filled 2022-09-26 (×2): qty 30, 30d supply, fill #1
  Filled 2022-11-14 – 2022-12-29 (×3): qty 30, 30d supply, fill #2
  Filled 2023-01-17: qty 30, 30d supply, fill #3

## 2022-08-23 NOTE — Assessment & Plan Note (Signed)
Repeat RPR today.  

## 2022-08-23 NOTE — Assessment & Plan Note (Addendum)
Very well controlled on once daily Biktarvy up until medication lapse (I think actually 5-6 months based on last fill history I see in record) recently with change in phone number and insurance. Also some housing changes. Has been back on about 2-3 weeks now. We talked about resuming the Bactrim for now pending his repeat CD4 count. He had some recovery to 186 with last lab draw. Will let him know if we can safely stop based on today's labs.  No changes to insurance coverage and patient assistance still active.  No dental needs today.  No concern over anxious/depressed mood.  Sexual health and family planning discussed - treatment as outlined below.  Vaccines updated today - deferred any recommended vaccines today.    RTC in 104m

## 2022-08-23 NOTE — Addendum Note (Signed)
Addended by: Clayborne Artist A on: 08/23/2022 05:14 PM   Modules accepted: Orders

## 2022-08-23 NOTE — Assessment & Plan Note (Signed)
Will treat for concern over gonorrhea today with 500 mg IM ceftriaxone x 1. Self collected rectal swab and oral/urine collected for chlamydia screening. Further tx with doxycycline if indicated.  Discussed abstaining from sex to allow rectal symptoms to resolve and heal up. Concern for a fissure - conservative tx discussed and he can follow with anorectal surgeon if this continues to be a problem - he is due for a follow up around now based on last note anyway.

## 2022-08-23 NOTE — Patient Instructions (Signed)
Nice to see you!  Will get you ceftriaxone treatment today and let you know what your swabs showed when they return.   Please continue the biktarvy and bactrim for now pending your blood work.   Would like to see you back in 4 months please

## 2022-08-23 NOTE — Progress Notes (Signed)
Name: DAUNE COLGATE  DOB: Dec 31, 2000 MRN: [id number] PCP: Nickola Major, MD     Brief Narrative:  Ronnie Marsh is a 21 y.o. male with HIV Dx 03/03/2021.  CD4 65 HIV Risk: sexual History of OIs: none Intake Labs 03/14/2021: Hep B sAg (-), sAb (-), cAb (-); Hep A (+), Hep C (-) Quantiferon (-) HLA B*5701 (-) G6PD: ()   Previous Regimens: Biktarvy   Genotypes: 03/14/2021 - wildtype  Subjective:   Chief Complaint  Patient presents with   Follow-up     HPI: Simmie is here for routine follow up care. LOV in the summer with VL 85 and CD4 up to 186. Nadir 65 at entry to care. He had a lapse in his medication and estimates off about 2 months but back on since end of November. Insurance changed, phone number changed but he has patient assistance active and available to him until April 24.   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 FU with surgeon in March 2023 - no new findings or recurrences since removal. Find during and after, but since then he has some straining with BMs. No blood but had the mucus discharge this week and dysuria. Had some difficulty passing bowel movement about 1 month ago. Discomfort with urination. Last time time he had receptive sex about 3 weeks ago.      08/23/2022   11:18 AM  Depression screen PHQ 2/9  Decreased Interest 0  Down, Depressed, Hopeless 0  PHQ - 2 Score 0    Review of Systems  Constitutional:  Negative for chills and fever.  HENT:  Negative for sore throat.   Eyes:  Negative for visual disturbance.  Gastrointestinal:  Positive for constipation and rectal pain. Negative for abdominal pain and anal bleeding.  Genitourinary:  Positive for dysuria. Negative for genital sores, penile discharge, penile pain, scrotal swelling and testicular pain.  Musculoskeletal:  Negative for arthralgias and joint swelling.  Skin:  Negative for rash.  Neurological:  Negative for headaches.  Hematological:  Negative for adenopathy.      Past Medical History:  Diagnosis Date   Asymptomatic HIV infection, with no history of HIV-related illness ([place]) 03/15/2021   Syphilis 03/15/2021    Outpatient Medications Prior to Visit  Medication Sig Dispense Refill   bictegravir-emtricitabine-tenofovir AF (BIKTARVY) 50-200-25 MG TABS tablet Take 1 tablet by mouth daily. 30 tablet 11   sulfamethoxazole-trimethoprim (BACTRIM) 400-80 MG tablet Take 1 tablet by mouth daily. 30 tablet 3   No facility-administered medications prior to visit.     Allergies  Allergen Reactions   Pineapple Itching    Raw pineapple makes his tongue/mouth itch    Social History   Tobacco Use   Smoking status: Former    Types: Cigarettes   Smokeless tobacco: Never  Vaping Use   Vaping Use: Never used  Substance Use Topics   Alcohol use: Yes    Comment: socially   Drug use: Yes    Types: Marijuana    Comment: socially     Social History   Substance and Sexual Activity  Sexual Activity Not Currently   Partners: Male   Comment: agreed condoms     Objective:   Vitals:   08/23/22 1118  BP: (!) 146/86  Pulse: 68  Resp: 16  SpO2: 100%  Weight: 142 lb (64.4 kg)  Height: _0  (1.778 m)   Body mass index is 20.37 kg/m.  Physical Exam Constitutional:      Appearance:  Normal appearance. He is not ill-appearing.  HENT:     Head: Normocephalic.     Mouth/Throat:     Mouth: Mucous membranes are moist.     Pharynx: Oropharynx is clear.  Eyes:     General: No scleral icterus. Cardiovascular:     Rate and Rhythm: Normal rate.  Pulmonary:     Effort: Pulmonary effort is normal.  Genitourinary:    Penis: Normal.      Comments: No tenderness palpating testicular cords. No swelling or erythema appreciated to skin of teste. No tenderness to teste today with palpation. +cremasteric reflex intact.  Musculoskeletal:        General: Normal range of motion.     Cervical back: Normal range of motion.  Skin:    Coloration: Skin is not  jaundiced or pale.  Neurological:     Mental Status: He is alert and oriented to person, place, and time.  Psychiatric:        Mood and Affect: Mood normal.        Judgment: Judgment normal.     Lab Results Lab Results  Component Value Date   WBC 3.9 03/14/2021   HGB 14.7 03/14/2021   HCT 44.1 03/14/2021   MCV 85.1 03/14/2021   PLT 231 03/14/2021    Lab Results  Component Value Date   CREATININE 1.20 07/03/2021   BUN 14 07/03/2021   NA 138 07/03/2021   K 4.0 07/03/2021   CL 99 07/03/2021   CO2 28 07/03/2021    Lab Results  Component Value Date   ALT 11 07/03/2021   AST 18 07/03/2021   ALKPHOS 54 06/10/2019   BILITOT 0.6 07/03/2021    No results found for: "CHOL", "HDL", "LDLCALC", "LDLDIRECT", "TRIG", "CHOLHDL" HIV 1 RNA Quant (Copies/mL)  Date Value  09/25/2021 85 (H)  07/03/2021 31 (H)  04/07/2021 125 (H)   CD4 T Cell Abs (/uL)  Date Value  09/25/2021 186 (L)  07/03/2021 142 (L)  03/14/2021 65 (L)     Assessment & Plan:   Problem List Items Addressed This Visit       Unprioritized   AIDS (acquired immune deficiency syndrome) (HCC)   Relevant Medications   bictegravir-emtricitabine-tenofovir AF (BIKTARVY) 50-200-25 MG TABS tablet   sulfamethoxazole-trimethoprim (BACTRIM) 400-80 MG tablet   Asymptomatic HIV infection, with no history of HIV-related illness ([place]) - Primary    Very well controlled on once daily Biktarvy up until medication lapse (I think actually 5-6 months based on last fill history I see in record) recently with change in phone number and insurance. Also some housing changes. Has been back on about 2-3 weeks now. We talked about resuming the Bactrim for now pending his repeat CD4 count. He had some recovery to 186 with last lab draw. Will let him know if we can safely stop based on today's labs.  No changes to insurance coverage and patient assistance still active.  No dental needs today.  No concern over anxious/depressed mood.  Sexual  health and family planning discussed - treatment as outlined below.  Vaccines updated today - deferred any recommended vaccines today.    RTC in 34m      Relevant Medications   bictegravir-emtricitabine-tenofovir AF (BIKTARVY) 50-200-25 MG TABS tablet   sulfamethoxazole-trimethoprim (BACTRIM) 400-80 MG tablet   Other Relevant Orders   HIV 1 RNA quant-no reflex-bld   T-helper cells (CD4) count   HIV RNA, RTPCR W/R GT (RTI, PI,INT)   COMPLETE METABOLIC PANEL WITH GFR  History of syphilis    Repeat RPR today      Rectal discharge    Will treat for concern over gonorrhea today with 500 mg IM ceftriaxone x 1. Self collected rectal swab and oral/urine collected for chlamydia screening. Further tx with doxycycline if indicated.  Discussed abstaining from sex to allow rectal symptoms to resolve and heal up. Concern for a fissure - conservative tx discussed and he can follow with anorectal surgeon if this continues to be a problem - he is due for a follow up around now based on last note anyway.       Relevant Orders   Urine cytology ancillary only   Cytology (oral, anal, urethral) ancillary only   Cytology (oral, anal, urethral) ancillary only   RPR    Janene Madeira, MSN, NP-C [facility] [place] Pager: [phone] Office: [phone]  08/23/22  12:09 PM

## 2022-08-24 LAB — T-HELPER CELLS (CD4) COUNT (NOT AT ARMC)
CD4 % Helper T Cell: 15 % — ABNORMAL LOW (ref 33–65)
CD4 T Cell Abs: 174 /uL — ABNORMAL LOW (ref 400–1790)

## 2022-08-24 LAB — CYTOLOGY, (ORAL, ANAL, URETHRAL) ANCILLARY ONLY
Chlamydia: NEGATIVE
Chlamydia: NEGATIVE
Comment: NEGATIVE
Comment: NEGATIVE
Comment: NORMAL
Comment: NORMAL
Neisseria Gonorrhea: NEGATIVE
Neisseria Gonorrhea: POSITIVE — AB

## 2022-08-24 LAB — URINE CYTOLOGY ANCILLARY ONLY
Chlamydia: NEGATIVE
Comment: NEGATIVE
Comment: NORMAL
Neisseria Gonorrhea: POSITIVE — AB

## 2022-08-25 ENCOUNTER — Other Ambulatory Visit (HOSPITAL_COMMUNITY): Payer: Self-pay

## 2022-08-27 ENCOUNTER — Other Ambulatory Visit (HOSPITAL_COMMUNITY): Payer: Self-pay

## 2022-08-27 ENCOUNTER — Other Ambulatory Visit: Payer: Self-pay

## 2022-08-27 NOTE — Progress Notes (Addendum)
Anal swab and urine returned positive for Gonorrhea. Patient treated during office visit on 08/23/22. Patient made aware of results.  Advised patient to remain abstinent for 7-10 days after treatment and reminded to use condoms for any sexual encounters to reduce risks of re-infection. Patient's questions answered to their satisfaction.  Valarie Cones, LPN

## 2022-08-28 ENCOUNTER — Telehealth: Payer: Self-pay

## 2022-08-28 NOTE — Progress Notes (Signed)
Please call Guilherme to schedule a ceftriaxone 500 mg IM x 1 injection for treatment of gonorrhea.   I messaged him through MyChart to notify of the results. Thank you kindly

## 2022-08-28 NOTE — Telephone Encounter (Signed)
Called patient to schedule appointment for nurse visit. Patient stated that he received Ceftriaxone during visit on 12/14.  Advised appointment is not necessary at this time. Encouraged he use condoms to avoid STD infections in the future.  Verbalized understanding. Juanita Laster, RMA

## 2022-08-28 NOTE — Telephone Encounter (Signed)
-----   Message from Blanchard Kelch, NP sent at 08/28/2022  4:44 PM EST ----- Please call Ronnie Marsh to schedule a ceftriaxone 500 mg IM x 1 injection for treatment of gonorrhea.   I messaged him through MyChart to notify of the results. Thank you kindly

## 2022-08-29 ENCOUNTER — Ambulatory Visit: Payer: 59

## 2022-08-29 LAB — COMPLETE METABOLIC PANEL WITH GFR
AG Ratio: 1 (calc) (ref 1.0–2.5)
ALT: 15 U/L (ref 9–46)
AST: 18 U/L (ref 10–40)
Albumin: 4.7 g/dL (ref 3.6–5.1)
Alkaline phosphatase (APISO): 60 U/L (ref 36–130)
BUN: 15 mg/dL (ref 7–25)
CO2: 31 mmol/L (ref 20–32)
Calcium: 10.3 mg/dL (ref 8.6–10.3)
Chloride: 99 mmol/L (ref 98–110)
Creat: 1.14 mg/dL (ref 0.60–1.24)
Globulin: 4.6 g/dL (calc) — ABNORMAL HIGH (ref 1.9–3.7)
Glucose, Bld: 49 mg/dL — ABNORMAL LOW (ref 65–99)
Potassium: 4.4 mmol/L (ref 3.5–5.3)
Sodium: 140 mmol/L (ref 135–146)
Total Bilirubin: 0.7 mg/dL (ref 0.2–1.2)
Total Protein: 9.3 g/dL — ABNORMAL HIGH (ref 6.1–8.1)
eGFR: 93 mL/min/{1.73_m2} (ref >=60)

## 2022-08-29 LAB — HIV-1 RNA QUANT-NO REFLEX-BLD
HIV 1 RNA Quant: 187 Copies/mL — ABNORMAL HIGH
HIV-1 RNA Quant, Log: 2.27 Log cps/mL — ABNORMAL HIGH

## 2022-08-29 LAB — RPR: RPR Ser Ql: REACTIVE — AB

## 2022-08-29 LAB — T PALLIDUM AB: T Pallidum Abs: POSITIVE — AB

## 2022-08-29 LAB — RPR TITER: RPR Titer: 1:4 {titer} — ABNORMAL HIGH

## 2022-08-29 LAB — HIV RNA, RTPCR W/R GT (RTI, PI,INT)
HIV 1 RNA Quant: 179 copies/mL — ABNORMAL HIGH
HIV-1 RNA Quant, Log: 2.25 Log copies/mL — ABNORMAL HIGH

## 2022-09-06 ENCOUNTER — Other Ambulatory Visit (HOSPITAL_COMMUNITY): Payer: Self-pay

## 2022-09-18 ENCOUNTER — Other Ambulatory Visit (HOSPITAL_COMMUNITY): Payer: Self-pay

## 2022-09-26 ENCOUNTER — Other Ambulatory Visit (HOSPITAL_COMMUNITY): Payer: Self-pay

## 2022-09-28 ENCOUNTER — Other Ambulatory Visit (HOSPITAL_COMMUNITY): Payer: Self-pay

## 2022-10-01 ENCOUNTER — Other Ambulatory Visit (HOSPITAL_COMMUNITY): Payer: Self-pay

## 2022-10-02 ENCOUNTER — Other Ambulatory Visit (HOSPITAL_COMMUNITY): Payer: Self-pay

## 2022-10-10 ENCOUNTER — Other Ambulatory Visit (HOSPITAL_COMMUNITY): Payer: Self-pay

## 2022-10-11 ENCOUNTER — Other Ambulatory Visit (HOSPITAL_COMMUNITY): Payer: Self-pay

## 2022-10-25 ENCOUNTER — Other Ambulatory Visit (HOSPITAL_COMMUNITY): Payer: Self-pay

## 2022-11-02 ENCOUNTER — Other Ambulatory Visit (HOSPITAL_COMMUNITY): Payer: Self-pay

## 2022-11-05 ENCOUNTER — Other Ambulatory Visit (HOSPITAL_COMMUNITY): Payer: Self-pay

## 2022-11-07 ENCOUNTER — Other Ambulatory Visit (HOSPITAL_COMMUNITY): Payer: Self-pay

## 2022-11-14 ENCOUNTER — Other Ambulatory Visit (HOSPITAL_COMMUNITY): Payer: Self-pay

## 2022-11-30 ENCOUNTER — Other Ambulatory Visit (HOSPITAL_COMMUNITY): Payer: Self-pay

## 2022-12-04 ENCOUNTER — Other Ambulatory Visit (HOSPITAL_COMMUNITY): Payer: Self-pay

## 2022-12-07 ENCOUNTER — Other Ambulatory Visit (HOSPITAL_COMMUNITY): Payer: Self-pay

## 2022-12-10 ENCOUNTER — Other Ambulatory Visit (HOSPITAL_COMMUNITY): Payer: Self-pay

## 2022-12-17 ENCOUNTER — Ambulatory Visit: Payer: 59 | Admitting: Infectious Diseases

## 2022-12-28 ENCOUNTER — Other Ambulatory Visit (HOSPITAL_COMMUNITY): Payer: Self-pay

## 2022-12-29 ENCOUNTER — Other Ambulatory Visit (HOSPITAL_COMMUNITY): Payer: Self-pay

## 2022-12-31 ENCOUNTER — Other Ambulatory Visit (HOSPITAL_COMMUNITY): Payer: Self-pay

## 2023-01-01 ENCOUNTER — Other Ambulatory Visit (HOSPITAL_BASED_OUTPATIENT_CLINIC_OR_DEPARTMENT_OTHER): Payer: Self-pay

## 2023-01-01 ENCOUNTER — Other Ambulatory Visit (HOSPITAL_COMMUNITY): Payer: Self-pay

## 2023-01-02 ENCOUNTER — Other Ambulatory Visit (HOSPITAL_COMMUNITY): Payer: Self-pay

## 2023-01-03 ENCOUNTER — Other Ambulatory Visit (HOSPITAL_BASED_OUTPATIENT_CLINIC_OR_DEPARTMENT_OTHER): Payer: Self-pay

## 2023-01-04 ENCOUNTER — Other Ambulatory Visit (HOSPITAL_COMMUNITY): Payer: Self-pay

## 2023-01-05 ENCOUNTER — Other Ambulatory Visit (HOSPITAL_COMMUNITY): Payer: Self-pay

## 2023-01-17 ENCOUNTER — Other Ambulatory Visit (HOSPITAL_COMMUNITY): Payer: Self-pay

## 2023-01-18 ENCOUNTER — Other Ambulatory Visit: Payer: Self-pay

## 2023-01-21 ENCOUNTER — Other Ambulatory Visit (HOSPITAL_COMMUNITY): Payer: Self-pay

## 2023-01-21 ENCOUNTER — Other Ambulatory Visit: Payer: Self-pay

## 2023-01-21 DIAGNOSIS — Z21 Asymptomatic human immunodeficiency virus [HIV] infection status: Secondary | ICD-10-CM

## 2023-01-21 DIAGNOSIS — B20 Human immunodeficiency virus [HIV] disease: Secondary | ICD-10-CM

## 2023-01-21 MED ORDER — SULFAMETHOXAZOLE-TRIMETHOPRIM 400-80 MG PO TABS: 1.0000 | ORAL_TABLET | Freq: Every day | ORAL | 0 refills | Status: DC

## 2023-01-21 MED ORDER — BIKTARVY 50-200-25 MG PO TABS: 1.0000 | ORAL_TABLET | Freq: Every day | ORAL | 0 refills | Status: DC

## 2023-02-05 ENCOUNTER — Ambulatory Visit: Payer: 59 | Admitting: Infectious Diseases

## 2023-02-05 ENCOUNTER — Telehealth: Payer: Self-pay

## 2023-02-05 NOTE — Telephone Encounter (Signed)
Mychart message sent.   Knoah Nedeau D Sherrie Marsan, RN  

## 2023-02-18 ENCOUNTER — Other Ambulatory Visit: Payer: Self-pay | Admitting: Infectious Diseases

## 2023-02-18 DIAGNOSIS — Z21 Asymptomatic human immunodeficiency virus [HIV] infection status: Secondary | ICD-10-CM

## 2023-02-20 ENCOUNTER — Telehealth: Payer: Self-pay

## 2023-02-20 NOTE — Telephone Encounter (Signed)
Called Ronnie Marsh to schedule appointment, call could not be completed on mobile number. Tried home phone number, wrong number.   Sandie Ano, RN

## 2023-02-20 NOTE — Telephone Encounter (Signed)
Called to schedule appointment, wrong contact information. Sent MyChart message.   Sandie Ano, RN

## 2023-03-25 ENCOUNTER — Other Ambulatory Visit: Payer: Self-pay | Admitting: Infectious Diseases

## 2023-03-25 ENCOUNTER — Telehealth: Payer: Self-pay

## 2023-03-25 DIAGNOSIS — Z21 Asymptomatic human immunodeficiency virus [HIV] infection status: Secondary | ICD-10-CM

## 2023-03-25 NOTE — Telephone Encounter (Signed)
Multiple attempts since 12/2022 to reach patient for appointment.   Sandie Ano, RN

## 2023-03-25 NOTE — Telephone Encounter (Signed)
Received refill request for Bactrim. Office has been trying to get in touch with Ernesto Rutherford since April to schedule an appointment. No answer and no voicemail set up on mobile number. Left HIPAA compliant voicemail requesting callback on home number. Previous MyChart messages have not been read/responded to. Biktarvy last dispensed 02/06/23 from [facility].   Sandie Ano, RN

## 2023-03-28 NOTE — Telephone Encounter (Signed)
Last dispensed 03/12/23. Clinic has been trying to reach [facility] to schedule an appointment.  Sandie Ano, RN

## 2023-04-25 ENCOUNTER — Other Ambulatory Visit: Payer: Self-pay | Admitting: Infectious Diseases

## 2023-04-25 DIAGNOSIS — Z21 Asymptomatic human immunodeficiency virus [HIV] infection status: Secondary | ICD-10-CM

## 2023-04-25 NOTE — Telephone Encounter (Signed)
Multiple attempts have been made to contact patient since 12/2022.  Sandie Ano, RN

## 2023-05-06 ENCOUNTER — Other Ambulatory Visit: Payer: Self-pay | Admitting: Infectious Diseases

## 2023-05-06 DIAGNOSIS — Z21 Asymptomatic human immunodeficiency virus [HIV] infection status: Secondary | ICD-10-CM

## 2023-05-10 DIAGNOSIS — B20 Human immunodeficiency virus [HIV] disease: Secondary | ICD-10-CM

## 2023-05-10 DIAGNOSIS — Z21 Asymptomatic human immunodeficiency virus [HIV] infection status: Secondary | ICD-10-CM

## 2023-05-10 MED ORDER — SULFAMETHOXAZOLE-TRIMETHOPRIM 400-80 MG PO TABS: 1.0000 | ORAL_TABLET | Freq: Every day | ORAL | 0 refills | Status: DC

## 2023-05-10 MED ORDER — BIKTARVY 50-200-25 MG PO TABS: 1.0000 | ORAL_TABLET | Freq: Every day | ORAL | 0 refills | Status: DC

## 2023-05-20 ENCOUNTER — Other Ambulatory Visit: Payer: Self-pay | Admitting: Infectious Diseases

## 2023-05-20 DIAGNOSIS — Z21 Asymptomatic human immunodeficiency virus [HIV] infection status: Secondary | ICD-10-CM

## 2023-05-31 ENCOUNTER — Ambulatory Visit (INDEPENDENT_AMBULATORY_CARE_PROVIDER_SITE_OTHER): Payer: 59 | Admitting: Infectious Diseases

## 2023-05-31 ENCOUNTER — Telehealth: Payer: Self-pay

## 2023-05-31 ENCOUNTER — Other Ambulatory Visit: Payer: Self-pay

## 2023-05-31 ENCOUNTER — Encounter: Payer: Self-pay | Admitting: Infectious Diseases

## 2023-05-31 VITALS — BP 117/83 | HR 88 | Resp 16 | Ht 70.0 in | Wt 142.0 lb

## 2023-05-31 DIAGNOSIS — Z8619 Personal history of other infectious and parasitic diseases: Secondary | ICD-10-CM | POA: Diagnosis not present

## 2023-05-31 DIAGNOSIS — B20 Human immunodeficiency virus [HIV] disease: Secondary | ICD-10-CM

## 2023-05-31 DIAGNOSIS — Z21 Asymptomatic human immunodeficiency virus [HIV] infection status: Secondary | ICD-10-CM

## 2023-05-31 DIAGNOSIS — Z113 Encounter for screening for infections with a predominantly sexual mode of transmission: Secondary | ICD-10-CM

## 2023-05-31 DIAGNOSIS — F4321 Adjustment disorder with depressed mood: Secondary | ICD-10-CM

## 2023-05-31 MED ORDER — BIKTARVY 50-200-25 MG PO TABS
1.0000 | ORAL_TABLET | Freq: Every day | ORAL | 11 refills | Status: DC
Start: 2023-05-31 — End: 2024-04-02

## 2023-05-31 NOTE — Patient Instructions (Addendum)
Come on back to see me in 4-6 months.   Please call [facility] @ [phone] to schedule a dental appointment   Stop by the lab today please - will see you later!   If we fill that counselor position I have a note to myself to message you on mychart.

## 2023-05-31 NOTE — Telephone Encounter (Signed)
Dental Form Filed.

## 2023-05-31 NOTE — Assessment & Plan Note (Signed)
Ronnie Marsh is here for follow up - has missed a few appts but reported adherence and observed fill history looks pretty good with only about 2 -4 weeks of delay in fills since beginning of the year.  Repeat VL, CD4 today. STI screenings - no symptoms.  Deferred vaccines.  Will get into dental clinic for routine cleanings.   Return in about 6 months (around 11/28/2023).

## 2023-05-31 NOTE — Assessment & Plan Note (Signed)
Clinically seems to be recovering - last CD4 175. Repeat today. Will hold off on Bactrim rx and see what CD4 returns as today.

## 2023-05-31 NOTE — Progress Notes (Signed)
Name: Ronnie Marsh  DOB: 2001-03-27 MRN: [id number] PCP: Gwenlyn Found, MD     Brief Narrative:  Ronnie Marsh is a 22 y.o. male with HIV Dx 03/03/2021.  CD4 65 HIV Risk: sexual History of OIs: none Intake Labs 03/14/2021: Hep B sAg (-), sAb (-), cAb (-); Hep A (+), Hep C (-) Quantiferon (-) HLA B*5701 (-) G6PD: ()   Previous Regimens: Biktarvy   Genotypes: 03/14/2021 - wildtype  Subjective:   Chief Complaint  Patient presents with   Follow-up     HPI: Ronnie Marsh is here for routine follow up care. LOV 08/2022 with VL ~180 and CD4 174.   No significant changes in weight or appetite -  Has had some mood changes. Would like to see a counselor if we have one available soon for him.  Has noticed some increased bleeding/bruising. Has been taking biktarvy well but with missed appts he did have some delay in rx. Fill history shows delayed fills 2x but only by 2 weeks in March / June. Has had a little increase in weight. Has not taken bactrim consistently and not sure he needs it anymore.       05/31/2023   10:29 AM  Depression screen PHQ 2/9  Decreased Interest 0  Down, Depressed, Hopeless 0  PHQ - 2 Score 0    Review of Systems  Constitutional:  Negative for appetite change, chills, fatigue, fever and unexpected weight change.  Eyes:  Negative for visual disturbance.  Respiratory:  Negative for cough and shortness of breath.   Cardiovascular:  Negative for chest pain and leg swelling.  Gastrointestinal:  Negative for abdominal pain, diarrhea and nausea.  Genitourinary:  Negative for dysuria, genital sores and penile discharge.  Musculoskeletal:  Negative for joint swelling.  Skin:  Negative for color change and rash.  Neurological:  Negative for dizziness and headaches.  Hematological:  Negative for adenopathy.  Psychiatric/Behavioral:  Negative for sleep disturbance. The patient is not nervous/anxious.      Past Medical History:  Diagnosis Date    Asymptomatic HIV infection, with no history of HIV-related illness (HCC) 03/15/2021   Syphilis 03/15/2021    Outpatient Medications Prior to Visit  Medication Sig Dispense Refill   sulfamethoxazole-trimethoprim (BACTRIM) 400-80 MG tablet Take 1 tablet by mouth daily. 30 tablet 0   bictegravir-emtricitabine-tenofovir AF (BIKTARVY) 50-200-25 MG TABS tablet Take 1 tablet by mouth daily. 30 tablet 0   No facility-administered medications prior to visit.     Allergies  Allergen Reactions   Pineapple Itching    Raw pineapple makes his tongue/mouth itch    Social History   Tobacco Use   Smoking status: Former    Types: Cigarettes   Smokeless tobacco: Never  Vaping Use   Vaping status: Never Used  Substance Use Topics   Alcohol use: Yes    Comment: socially   Drug use: Yes    Types: Marijuana    Comment: socially     Social History   Substance and Sexual Activity  Sexual Activity Not Currently   Partners: Male   Comment: agreed condoms     Objective:   Vitals:   05/31/23 1026  BP: 117/83  Pulse: 88  Resp: 16  Weight: 142 lb (64.4 kg)  Height: 5\' 10"  (1.778 m)   Body mass index is 20.37 kg/m.  Physical Exam HENT:     Mouth/Throat:     Mouth: No oral lesions.     Dentition: Normal dentition. No  dental caries.  Eyes:     General: No scleral icterus. Cardiovascular:     Rate and Rhythm: Normal rate and regular rhythm.     Heart sounds: Normal heart sounds.  Pulmonary:     Effort: Pulmonary effort is normal.     Breath sounds: Normal breath sounds.  Abdominal:     General: There is no distension.     Palpations: Abdomen is soft.     Tenderness: There is no abdominal tenderness.  Lymphadenopathy:     Cervical: No cervical adenopathy.  Skin:    General: Skin is warm and dry.     Findings: No rash.  Neurological:     Mental Status: He is alert and oriented to person, place, and time.     Lab Results Lab Results  Component Value Date   WBC 3.9  03/14/2021   HGB 14.7 03/14/2021   HCT 44.1 03/14/2021   MCV 85.1 03/14/2021   PLT 231 03/14/2021    Lab Results  Component Value Date   CREATININE 1.14 08/23/2022   BUN 15 08/23/2022   NA 140 08/23/2022   K 4.4 08/23/2022   CL 99 08/23/2022   CO2 31 08/23/2022    Lab Results  Component Value Date   ALT 15 08/23/2022   AST 18 08/23/2022   ALKPHOS 54 06/10/2019   BILITOT 0.7 08/23/2022    No results found for: "CHOL", "HDL", "LDLCALC", "LDLDIRECT", "TRIG", "CHOLHDL" HIV 1 RNA Quant  Date Value  08/23/2022 187 Copies/mL (H)  08/23/2022 179 copies/mL (H)  09/25/2021 85 Copies/mL (H)   CD4 T Cell Abs (/uL)  Date Value  08/23/2022 174 (L)  09/25/2021 186 (L)  07/03/2021 142 (L)     Assessment & Plan:   Problem List Items Addressed This Visit       Unprioritized   History of syphilis    Rescreen today - no symptoms or exposures       Asymptomatic HIV infection, with no history of HIV-related illness (HCC)    Ronnie Marsh is here for follow up - has missed a few appts but reported adherence and observed fill history looks pretty good with only about 2 -4 weeks of delay in fills since beginning of the year.  Repeat VL, CD4 today. STI screenings - no symptoms.  Deferred vaccines.  Will get into dental clinic for routine cleanings.   Return in about 6 months (around 11/28/2023).       Relevant Medications   bictegravir-emtricitabine-tenofovir AF (BIKTARVY) 50-200-25 MG TABS tablet   Other Relevant Orders   CBC   T-helper cells (CD4) count   HIV 1 RNA quant-no reflex-bld   CT/NG RNA, TMA Rectal   GC/CT Probe, Amp (Throat)   C. trachomatis/N. gonorrhoeae RNA   AIDS (acquired immune deficiency syndrome) (HCC)    Clinically seems to be recovering - last CD4 175. Repeat today. Will hold off on Bactrim rx and see what CD4 returns as today.       Relevant Medications   bictegravir-emtricitabine-tenofovir AF (BIKTARVY) 50-200-25 MG TABS tablet   Adjustment reaction     Mood is still fluctuating - would benefit from counseling - will email him when our position is filled which I expect to be soon. If it does not work out can send other resources for him that he may look into.       Other Visit Diagnoses     Routine screening for STI (sexually transmitted infection)    -  Primary  Relevant Orders   RPR   CT/NG RNA, TMA Rectal   GC/CT Probe, Amp (Throat)   C. trachomatis/N. gonorrhoeae RNA        Rexene Alberts, MSN, NP-C [facility] Pager: [phone] Office: [phone]  05/31/23  12:41 PM

## 2023-05-31 NOTE — Assessment & Plan Note (Signed)
Rescreen today - no symptoms or exposures

## 2023-05-31 NOTE — Assessment & Plan Note (Signed)
Mood is still fluctuating - would benefit from counseling - will email him when our position is filled which I expect to be soon. If it does not work out can send other resources for him that he may look into.

## 2023-06-01 LAB — C. TRACHOMATIS/N. GONORRHOEAE RNA
C. trachomatis RNA, TMA: NOT DETECTED
N. gonorrhoeae RNA, TMA: NOT DETECTED

## 2023-06-01 LAB — CT/NG RNA, TMA RECTAL
Chlamydia Trachomatis RNA: NOT DETECTED
Neisseria Gonorrhoeae RNA: NOT DETECTED

## 2023-06-01 LAB — GC/CHLAMYDIA PROBE, AMP (THROAT)
Chlamydia trachomatis RNA: NOT DETECTED
Neisseria gonorrhoeae RNA: NOT DETECTED

## 2023-06-04 LAB — RPR TITER: RPR Titer: 1:4 {titer} — ABNORMAL HIGH

## 2023-06-04 LAB — CBC
HCT: 48.8 % (ref 38.5–50.0)
Hemoglobin: 16.5 g/dL (ref 13.2–17.1)
MCH: 30.5 pg (ref 27.0–33.0)
MCHC: 33.8 g/dL (ref 32.0–36.0)
MCV: 90.2 fL (ref 80.0–100.0)
MPV: 10.1 fL (ref 7.5–12.5)
Platelets: 277 10*3/uL (ref 140–400)
RBC: 5.41 10*6/uL (ref 4.20–5.80)
RDW: 12.5 % (ref 11.0–15.0)
WBC: 5.2 10*3/uL (ref 3.8–10.8)

## 2023-06-04 LAB — T-HELPER CELLS (CD4) COUNT (NOT AT ARMC)
Absolute CD4: 270 cells/uL — ABNORMAL LOW (ref 490–1740)
CD4 T Helper %: 21 % — ABNORMAL LOW (ref 30–61)
Total lymphocyte count: 1277 cells/uL (ref 850–3900)

## 2023-06-04 LAB — HIV-1 RNA QUANT-NO REFLEX-BLD
HIV 1 RNA Quant: 60 Copies/mL — ABNORMAL HIGH
HIV-1 RNA Quant, Log: 1.78 Log cps/mL — ABNORMAL HIGH

## 2023-06-04 LAB — RPR: RPR Ser Ql: REACTIVE — AB

## 2023-06-04 LAB — T PALLIDUM AB: T Pallidum Abs: POSITIVE — AB

## 2023-11-19 ENCOUNTER — Encounter: Payer: Self-pay | Admitting: Infectious Diseases

## 2023-11-19 ENCOUNTER — Other Ambulatory Visit (HOSPITAL_COMMUNITY): Payer: Self-pay

## 2023-11-19 ENCOUNTER — Ambulatory Visit: Payer: 59 | Admitting: Infectious Diseases

## 2023-11-19 ENCOUNTER — Other Ambulatory Visit (HOSPITAL_COMMUNITY)
Admission: RE | Admit: 2023-11-19 | Discharge: 2023-11-19 | Disposition: A | Discharge status: Home / Self Care | Source: Ambulatory Visit | Attending: Infectious Diseases | Admitting: Infectious Diseases

## 2023-11-19 ENCOUNTER — Other Ambulatory Visit: Payer: Self-pay

## 2023-11-19 VITALS — BP 156/91 | HR 64 | Temp 98.7°F | Wt 163.0 lb

## 2023-11-19 DIAGNOSIS — Z113 Encounter for screening for infections with a predominantly sexual mode of transmission: Secondary | ICD-10-CM | POA: Diagnosis present

## 2023-11-19 DIAGNOSIS — Z21 Asymptomatic human immunodeficiency virus [HIV] infection status: Secondary | ICD-10-CM | POA: Diagnosis not present

## 2023-11-19 DIAGNOSIS — Z8619 Personal history of other infectious and parasitic diseases: Secondary | ICD-10-CM | POA: Diagnosis not present

## 2023-11-19 NOTE — Progress Notes (Signed)
 I was present during evaluation of the patient and agree with the outlined plan below.   HIV - will work on correcting barriers for medication; appreciate Donna's assistance to resolve. Well controlled on biktarvy. Clinically has recovered nicely. He stopped the bactrim. Last CD4 was 180. Will repeat today to see how he has immunologically recovered. Will need consistent time on medication to allow for recovery. He completed HPV series with 2 dose for age < 15 at the time of administration.   STI screening today - asymptomatic. Will follow up results.    Elevated blood pressure reading - SBP > 150. Will follow up this for him next time and have him consider checking home BP assessments periodically.   Return in about 4 months (around 03/20/2024).    Rexene Alberts, MSN, NP-C [facility]  [email or web address] Pager: [phone] Office: [phone] [facility] Main Line: [phone] *Secure Chat Communication Welcome

## 2023-11-19 NOTE — Assessment & Plan Note (Signed)
 STI testing today + RPR. Condoms given. No new partners since November. No symptoms or concerns. Will follow up with results.

## 2023-11-19 NOTE — Patient Instructions (Addendum)
 Keep [place] posted on your Biktarvy refills. If you continue to have problems with [facility] filling it, let us know and we can try to help problem solve. We'll keep you posted about your lab results as they come in.   Follow up in 4 months or sooner if needed. It was nice to meet you!  - Rocklin Soderquist

## 2023-11-19 NOTE — Assessment & Plan Note (Addendum)
 Continue Biktarvy. Provided 7 day sample. Pharmacy team contacted [facility] pharmacy. Ronnie Marsh will contact team to update financial department with co-pay card information. Update labs today - CD4, VL and CMP. Bactrim discontinued at last visit. No longer needed.  Continue 2-3 meals a day and increase activity.  Condoms provided. Dental appointment tomorrow. Follow up in 4 months or sooner if needed.

## 2023-11-19 NOTE — Progress Notes (Signed)
 Name: Ronnie Marsh  DOB: 08-27-2001 MRN: [id number] PCP: Gwenlyn Found, MD     Brief Narrative:  Ronnie Marsh is a 23 y.o. male with HIV Dx 03/03/2021.  CD4 65 HIV Risk: sexual History of OIs: none Intake Labs 03/14/2021: Hep B sAg (-), sAb (-), cAb (-); Hep A (+), Hep C (-) Quantiferon (-) HLA B*5701 (-) G6PD: ()   Previous Regimens: Biktarvy   Genotypes: 03/14/2021 - wildtype  Subjective:   Chief Complaint  Patient presents with   Follow-up    Missed one dose/ delay in meds=      HPI: Ronnie Marsh is here for routine follow up care. Reports running out of Biktarvy last night. [facility] reporting delay - requesting samples until able to receive next refill. Reports weight gain which he is happy with. States he has a new job working for [employer] allowing him to each more regularly. He's requesting STI testing today. No symptoms. No new partners - reports last sexual encounter was in November. Has not taken Bactrim since last visit in September as instructed. No concerns today aside from refill delay.         05/31/2023   10:29 AM  Depression screen PHQ 2/9  Decreased Interest 0  Down, Depressed, Hopeless 0  PHQ - 2 Score 0    Review of Systems  Constitutional:  Negative for appetite change, chills, fatigue, fever and unexpected weight change.       + weight gain   Eyes:  Negative for visual disturbance.  Respiratory:  Negative for cough and shortness of breath.   Cardiovascular:  Negative for chest pain and leg swelling.  Gastrointestinal:  Negative for abdominal pain, diarrhea and nausea.  Genitourinary:  Negative for dysuria, genital sores and penile discharge.  Musculoskeletal:  Negative for joint swelling.  Skin:  Negative for color change and rash.  Neurological:  Negative for dizziness and headaches.  Hematological:  Negative for adenopathy.  Psychiatric/Behavioral:  Negative for sleep disturbance. The patient is not nervous/anxious.       Past Medical History:  Diagnosis Date   Asymptomatic HIV infection, with no history of HIV-related illness (HCC) 03/15/2021   Syphilis 03/15/2021    Outpatient Medications Prior to Visit  Medication Sig Dispense Refill   bictegravir-emtricitabine-tenofovir AF (BIKTARVY) 50-200-25 MG TABS tablet Take 1 tablet by mouth daily. 30 tablet 11   sulfamethoxazole-trimethoprim (BACTRIM) 400-80 MG tablet Take 1 tablet by mouth daily. 30 tablet 0   No facility-administered medications prior to visit.     Allergies  Allergen Reactions   Pineapple Itching    Raw pineapple makes his tongue/mouth itch    Social History   Tobacco Use   Smoking status: Former    Types: Cigarettes   Smokeless tobacco: Never  Vaping Use   Vaping status: Never Used  Substance Use Topics   Alcohol use: Yes    Comment: socially   Drug use: Yes    Types: Marijuana    Comment: socially     Social History   Substance and Sexual Activity  Sexual Activity Not Currently   Partners: Male   Comment: agreed condoms     Objective:   Vitals:   11/19/23 1019  BP: (!) 156/91  Pulse: 64  Temp: 98.7 F (37.1 C)  TempSrc: Oral  SpO2: 100%  Weight: 163 lb (73.9 kg)   Body mass index is 23.39 kg/m.  Physical Exam Constitutional:      Appearance: Normal appearance.  HENT:     Mouth/Throat:     Mouth: No oral lesions.     Dentition: Normal dentition. No dental caries.  Eyes:     General: No scleral icterus. Cardiovascular:     Rate and Rhythm: Normal rate and regular rhythm.     Heart sounds: Normal heart sounds.  Pulmonary:     Effort: Pulmonary effort is normal.     Breath sounds: Normal breath sounds.  Abdominal:     General: There is no distension.     Palpations: Abdomen is soft.     Tenderness: There is no abdominal tenderness.  Musculoskeletal:        General: Normal range of motion.  Lymphadenopathy:     Cervical: No cervical adenopathy.  Skin:    General: Skin is warm and dry.      Findings: No rash.  Neurological:     Mental Status: He is alert and oriented to person, place, and time.  Psychiatric:        Mood and Affect: Mood normal.        Behavior: Behavior normal.        Thought Content: Thought content normal.        Judgment: Judgment normal.     Lab Results Lab Results  Component Value Date   WBC 5.2 05/31/2023   HGB 16.5 05/31/2023   HCT 48.8 05/31/2023   MCV 90.2 05/31/2023   PLT 277 05/31/2023    Lab Results  Component Value Date   CREATININE 1.14 08/23/2022   BUN 15 08/23/2022   NA 140 08/23/2022   K 4.4 08/23/2022   CL 99 08/23/2022   CO2 31 08/23/2022    Lab Results  Component Value Date   ALT 15 08/23/2022   AST 18 08/23/2022   ALKPHOS 54 06/10/2019   BILITOT 0.7 08/23/2022    No results found for: "CHOL", "HDL", "LDLCALC", "LDLDIRECT", "TRIG", "CHOLHDL" HIV 1 RNA Quant  Date Value  05/31/2023 60 Copies/mL (H)  08/23/2022 187 Copies/mL (H)  08/23/2022 179 copies/mL (H)   CD4 T Cell Abs (/uL)  Date Value  08/23/2022 174 (L)  09/25/2021 186 (L)  07/03/2021 142 (L)     Assessment & Plan:   Problem List Items Addressed This Visit       Other   Asymptomatic HIV infection, with no history of HIV-related illness (HCC) - Primary   Continue Biktarvy. Provided 7 day sample. Pharmacy team [facility]. Mackenzie will contact team to update financial department with co-pay card information. Update labs today - CD4, VL and CMP. Bactrim discontinued at last visit. No longer needed.  Continue 2-3 meals a day and increase activity.  Condoms provided. Dental appointment tomorrow. Follow up in 4 months or sooner if needed.       Relevant Orders   T-helper cells (CD4) count   HIV 1 RNA quant-no reflex-bld   Comprehensive metabolic panel   History of syphilis   STI testing today + RPR. Condoms given. No new partners since November. No symptoms or concerns. Will follow up with results.       Other Visit  Diagnoses       Routine screening for STI (sexually transmitted infection)       Relevant Orders   RPR   Cytology (oral, anal, urethral) ancillary only   Cytology (oral, anal, urethral) ancillary only   Urine cytology ancillary only        Geronimo Boot, RN, MPH Family Nurse Practitioner  Student [employer]     11/19/23  11:34 AM

## 2023-11-20 ENCOUNTER — Other Ambulatory Visit: Payer: Self-pay | Admitting: Pharmacist

## 2023-11-20 DIAGNOSIS — Z21 Asymptomatic human immunodeficiency virus [HIV] infection status: Secondary | ICD-10-CM

## 2023-11-20 LAB — URINE CYTOLOGY ANCILLARY ONLY
Chlamydia: NEGATIVE
Comment: NEGATIVE
Comment: NORMAL
Neisseria Gonorrhea: NEGATIVE

## 2023-11-20 LAB — CYTOLOGY, (ORAL, ANAL, URETHRAL) ANCILLARY ONLY
Chlamydia: NEGATIVE
Chlamydia: NEGATIVE
Comment: NEGATIVE
Comment: NEGATIVE
Comment: NORMAL
Comment: NORMAL
Neisseria Gonorrhea: NEGATIVE
Neisseria Gonorrhea: NEGATIVE

## 2023-11-20 LAB — T-HELPER CELLS (CD4) COUNT (NOT AT ARMC)
CD4 % Helper T Cell: 20 % — ABNORMAL LOW (ref 33–65)
CD4 T Cell Abs: 229 /uL — ABNORMAL LOW (ref 400–1790)

## 2023-11-20 MED ORDER — BIKTARVY 50-200-25 MG PO TABS: 1.0000 | ORAL_TABLET | Freq: Every day | ORAL | Status: AC

## 2023-11-20 NOTE — Progress Notes (Signed)
 Medication Samples have been provided to the patient.  Drug name: Biktarvy        Strength: 50/200/25 mg       Qty: 7 tablets   LOT: [id number]   Exp.Date: 02/2026  Samples requested by Rexene Alberts. NP.  Dosing instructions: Take one tablet by mouth once daily  The patient has been instructed regarding the correct time, dose, and frequency of taking this medication, including desired effects and most common side effects.   Maggi Hershkowitz L. Jannette Fogo, PharmD, BCIDP, AAHIVP, CPP Clinical Pharmacist Practitioner Infectious Diseases Clinical Pharmacist [facility] 08/22/2020, 10:07 AM

## 2023-11-23 LAB — RPR TITER: RPR Titer: 1:2 {titer} — ABNORMAL HIGH

## 2023-11-23 LAB — COMPREHENSIVE METABOLIC PANEL
AG Ratio: 1.4 (calc) (ref 1.0–2.5)
ALT: 18 U/L (ref 9–46)
AST: 20 U/L (ref 10–40)
Albumin: 4.7 g/dL (ref 3.6–5.1)
Alkaline phosphatase (APISO): 47 U/L (ref 36–130)
BUN: 14 mg/dL (ref 7–25)
CO2: 29 mmol/L (ref 20–32)
Calcium: 9.8 mg/dL (ref 8.6–10.3)
Chloride: 101 mmol/L (ref 98–110)
Creat: 1.12 mg/dL (ref 0.60–1.24)
Globulin: 3.3 g/dL (ref 1.9–3.7)
Glucose, Bld: 83 mg/dL (ref 65–99)
Potassium: 3.9 mmol/L (ref 3.5–5.3)
Sodium: 139 mmol/L (ref 135–146)
Total Bilirubin: 0.4 mg/dL (ref 0.2–1.2)
Total Protein: 8 g/dL (ref 6.1–8.1)

## 2023-11-23 LAB — HIV-1 RNA QUANT-NO REFLEX-BLD
HIV 1 RNA Quant: 49 {copies}/mL — ABNORMAL HIGH
HIV-1 RNA Quant, Log: 1.69 {Log_copies}/mL — ABNORMAL HIGH

## 2023-11-23 LAB — T PALLIDUM AB: T Pallidum Abs: POSITIVE — AB

## 2023-11-23 LAB — RPR: RPR Ser Ql: REACTIVE — AB

## 2024-04-02 ENCOUNTER — Other Ambulatory Visit: Payer: Self-pay

## 2024-04-02 ENCOUNTER — Ambulatory Visit (INDEPENDENT_AMBULATORY_CARE_PROVIDER_SITE_OTHER): Admitting: Infectious Diseases

## 2024-04-02 ENCOUNTER — Other Ambulatory Visit (HOSPITAL_COMMUNITY)
Admission: RE | Admit: 2024-04-02 | Discharge: 2024-04-02 | Disposition: A | Discharge status: Home / Self Care | Source: Ambulatory Visit | Attending: Infectious Diseases | Admitting: Infectious Diseases

## 2024-04-02 ENCOUNTER — Encounter: Payer: Self-pay | Admitting: Infectious Diseases

## 2024-04-02 VITALS — BP 131/79 | HR 61 | Temp 98.5°F | Wt 169.6 lb

## 2024-04-02 DIAGNOSIS — Z21 Asymptomatic human immunodeficiency virus [HIV] infection status: Secondary | ICD-10-CM | POA: Diagnosis not present

## 2024-04-02 DIAGNOSIS — Z113 Encounter for screening for infections with a predominantly sexual mode of transmission: Secondary | ICD-10-CM | POA: Insufficient documentation

## 2024-04-02 DIAGNOSIS — Z8619 Personal history of other infectious and parasitic diseases: Secondary | ICD-10-CM | POA: Diagnosis not present

## 2024-04-02 DIAGNOSIS — Z1159 Encounter for screening for other viral diseases: Secondary | ICD-10-CM | POA: Diagnosis not present

## 2024-04-02 DIAGNOSIS — Z23 Encounter for immunization: Secondary | ICD-10-CM | POA: Diagnosis not present

## 2024-04-02 MED ORDER — BIKTARVY 50-200-25 MG PO TABS: 1.0000 | ORAL_TABLET | Freq: Every day | ORAL | 11 refills | Status: DC

## 2024-04-02 MED ORDER — BIKTARVY 50-200-25 MG PO TABS: 1.0000 | ORAL_TABLET | Freq: Every day | ORAL | 11 refills | Status: AC

## 2024-04-02 NOTE — Addendum Note (Signed)
 Addended by: Archie Atilano M on: 04/02/2024 12:22 PM   Modules accepted: Orders

## 2024-04-02 NOTE — Progress Notes (Signed)
 Name: KILLIAN Marsh  DOB: Aug 06, 2001 MRN: [id number] PCP: Leila Lucie LABOR, MD     Brief Narrative:  Ronnie Marsh is a 23 y.o. male with HIV Dx 03/03/2021.  CD4 65 HIV Risk: sexual History of OIs: none Intake Labs 03/14/2021: Hep B sAg (-), sAb (-), cAb (-); Hep A (+), Hep C (-) Quantiferon (-) HLA B*5701 (-) G6PD: ()   Previous Regimens: Biktarvy    Genotypes: 03/14/2021 - wildtype  Subjective:   CC: Routine follow up care    Discussed the use of AI scribe software for clinical note transcription with the patient, who gave verbal consent to proceed.  History of Present Illness   Ronnie Marsh is a 23 year old male with HIV who presents for routine follow-up care.  He is stable on his current antiretroviral therapy, Biktarvy , which he takes once daily without any side effects or issues with access. His CD4 count has increased from 65 to 229.  He has a healthy appetite and has gained weight, now weighing around 160 pounds, which is above his previous weight of 145 pounds. He describes having a 'healthy appetite' and 'healthy mindset'.  He is interested in completing his HPV vaccination series and is due for his final dose, having received two doses previously.  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 No new symptoms or concerns are reported, and he is interested in routine sexual health checks.           05/31/2023   10:29 AM  Depression screen PHQ 2/9  Decreased Interest 0  Down, Depressed, Hopeless 0  PHQ - 2 Score 0    Review of Systems  Constitutional:  Negative for appetite change, chills, fatigue, fever and unexpected weight change.  Eyes:  Negative for visual disturbance.  Respiratory:  Negative for cough and shortness of breath.   Cardiovascular:  Negative for chest pain and leg swelling.  Gastrointestinal:  Negative for abdominal pain, diarrhea and nausea.  Genitourinary:   Negative for dysuria, genital sores and penile discharge.  Musculoskeletal:  Negative for joint swelling.  Skin:  Negative for color change and rash.  Neurological:  Negative for dizziness and headaches.  Hematological:  Negative for adenopathy.  Psychiatric/Behavioral:  Negative for sleep disturbance. The patient is not nervous/anxious.      Past Medical History:  Diagnosis Date   Asymptomatic HIV infection, with no history of HIV-related illness (HCC) 03/15/2021   Syphilis 03/15/2021    Outpatient Medications Prior to Visit  Medication Sig Dispense Refill   bictegravir-emtricitabine -tenofovir  AF (BIKTARVY ) 50-200-25 MG TABS tablet Take 1 tablet by mouth daily. 30 tablet 11   No facility-administered medications prior to visit.     Allergies  Allergen Reactions   Pineapple Itching    Raw pineapple makes his tongue/mouth itch    Social History   Tobacco Use   Smoking status: Former    Types: Cigarettes   Smokeless tobacco: Never  Vaping Use   Vaping status: Never Used  Substance Use Topics   Alcohol use: Yes    Comment: socially   Drug use: Yes    Types: Marijuana    Comment: socially     Social History   Substance and Sexual Activity  Sexual Activity Not Currently   Partners: Male   Comment: agreed condoms     Objective:   Vitals:   04/02/24 1116  BP: 131/79  Pulse: 61  Temp: 98.5 F (36.9 C)  TempSrc: Oral  SpO2: 100%  Weight: 169 lb 9.6 oz (76.9 kg)   Body mass index is 24.34 kg/m.  Physical Exam HENT:     Mouth/Throat:     Mouth: No oral lesions.     Dentition: Normal dentition. No dental caries.  Eyes:     General: No scleral icterus. Cardiovascular:     Rate and Rhythm: Normal rate and regular rhythm.     Heart sounds: Normal heart sounds.  Pulmonary:     Effort: Pulmonary effort is normal.     Breath sounds: Normal breath sounds.  Abdominal:     General: There is no distension.     Palpations: Abdomen is soft.     Tenderness:  There is no abdominal tenderness.  Lymphadenopathy:     Cervical: No cervical adenopathy.  Skin:    General: Skin is warm and dry.     Findings: No rash.  Neurological:     Mental Status: He is alert and oriented to person, place, and time.     Lab Results Lab Results  Component Value Date   WBC 5.2 05/31/2023   HGB 16.5 05/31/2023   HCT 48.8 05/31/2023   MCV 90.2 05/31/2023   PLT 277 05/31/2023    Lab Results  Component Value Date   CREATININE 1.12 11/19/2023   BUN 14 11/19/2023   NA 139 11/19/2023   K 3.9 11/19/2023   CL 101 11/19/2023   CO2 29 11/19/2023    Lab Results  Component Value Date   ALT 18 11/19/2023   AST 20 11/19/2023   ALKPHOS 54 06/10/2019   BILITOT 0.4 11/19/2023    No results found for: CHOL, HDL, LDLCALC, LDLDIRECT, TRIG, CHOLHDL HIV 1 RNA Quant  Date Value  11/19/2023 49 Copies/mL (H)  05/31/2023 60 Copies/mL (H)  08/23/2022 187 Copies/mL (H)  08/23/2022 179 copies/mL (H)   CD4 T Cell Abs (/uL)  Date Value  11/19/2023 229 (L)  08/23/2022 174 (L)  09/25/2021 186 (L)     Assessment & Plan:  Assessment and Plan    HIV infection - HIV infection is well-controlled with once daily Biktarvy . CD4 count has improved from 65 to 229, indicating good immune recovery. Goal is to achieve CD4 count above 400 for normal immune function.  - Continue Biktarvy  once daily. - Order blood work to check CD4 count and syphilis screening. - Administer final dose of HPV vaccine today.  - consider Menveo and Prevnar 20 at next appointment   Health maintenance -  - HPV #3 today  - Menveo and Prevnar due next OV - sexual health testing collected today - asymptomatic  - Order hepatitis B surface antibody titer to assess immunity. - Order measles titer to assess immunity due to recent cases in [place] .      No orders of the defined types were placed in this encounter.  Orders Placed This Encounter  Procedures   Hepatitis B surface  antibody,qualitative   HIV 1 RNA quant-no reflex-bld   RPR   T-helper cells (CD4) count   Measles/Mumps/Rubella Immunity   Return in about 4 months (around 08/03/2024).   Corean Fireman, MSN, NP-C [facility] for Infectious Disease [facility] Pager: [phone] Office: [phone]  04/02/24  12:01 PM

## 2024-04-02 NOTE — Patient Instructions (Addendum)
 Nice to see you!   No changes today - will send in refills for you and let you know if you need measles vaccine again   3-4 month follow up per your preference

## 2024-04-03 LAB — CYTOLOGY, (ORAL, ANAL, URETHRAL) ANCILLARY ONLY
Chlamydia: NEGATIVE
Chlamydia: NEGATIVE
Comment: NEGATIVE
Comment: NEGATIVE
Comment: NORMAL
Comment: NORMAL
Neisseria Gonorrhea: NEGATIVE
Neisseria Gonorrhea: NEGATIVE

## 2024-04-03 LAB — URINE CYTOLOGY ANCILLARY ONLY
Chlamydia: NEGATIVE
Comment: NEGATIVE
Comment: NORMAL
Neisseria Gonorrhea: NEGATIVE

## 2024-04-03 LAB — T-HELPER CELLS (CD4) COUNT (NOT AT ARMC)
CD4 % Helper T Cell: 23 % — ABNORMAL LOW (ref 33–65)
CD4 T Cell Abs: 367 /uL — ABNORMAL LOW (ref 400–1790)

## 2024-04-07 LAB — RPR: RPR Ser Ql: REACTIVE — AB

## 2024-04-07 LAB — RPR TITER: RPR Titer: 1:2 {titer} — ABNORMAL HIGH

## 2024-04-07 LAB — HIV-1 RNA QUANT-NO REFLEX-BLD
HIV 1 RNA Quant: <20 {copies}/mL — AB
HIV-1 RNA Quant, Log: <1.3 {Log_copies}/mL — AB

## 2024-04-07 LAB — T PALLIDUM AB: T Pallidum Abs: POSITIVE — AB

## 2024-04-07 LAB — MEASLES/MUMPS/RUBELLA IMMUNITY
Mumps IgG: 279 [AU]/ml
Rubella: 7.13 {index}
Rubeola IgG: 60.6 [AU]/ml

## 2024-04-07 LAB — HEPATITIS B SURFACE ANTIBODY,QUALITATIVE: Hep B S Ab: REACTIVE — AB

## 2024-04-13 ENCOUNTER — Ambulatory Visit: Payer: Self-pay | Admitting: Infectious Diseases

## 2024-07-14 NOTE — Progress Notes (Signed)
   Subjective:   Chief Complaint  Patient presents with  . Toe Injury    Pt presents with a Toe injury left small toe . Pt hit his toe against a door frame at a high speed while running. Pt has not taken any OTC med for pain.      History of Present Illness Patient presents with left little toe injury. Uncertain if fracture. Retains mobility but experiences tenderness and discomfort with pressure during standing or walking. Injury occurred while running barefoot indoors and collided with door frame.   Parts of patient history reviewed include PMH, problem list, medications, allergies, and social history.  Objective:   Vitals:   07/14/24 1341  BP: 137/75  Pulse: 83  Resp: 18  Temp: 98 F (36.7 C)  TempSrc: Oral  SpO2: 100%  Weight: 77.1 kg (170 lb)  Height: 1.727 m (5' 8)    Physical Exam Constitutional:      Appearance: Normal appearance.  Eyes:     Extraocular Movements: Extraocular movements intact.  Cardiovascular:     Rate and Rhythm: Normal rate.     Pulses: Normal pulses.  Pulmonary:     Effort: Pulmonary effort is normal.  Musculoskeletal:     Comments: Left foot: Mild swelling and mod TTP noted to the MTP joint No definitive bruising/ecchymosis noted  Skin:    General: Skin is warm and dry.  Neurological:     General: No focal deficit present.     Mental Status: He is alert and oriented to person, place, and time.            Assessment/Plan:   Cedar Ditullio was seen today for toe injury.  Diagnoses and all orders for this visit:  Injury of left toe, initial encounter -     XR Toes Minimum 2 Views Left  Contusion of lesser toe of left foot without damage to nail, initial encounter     Assessment & Plan MARCIA LEPERA is a 23 y.o. male is here for left little toe pain after striking it on a doorway.   Xray reveals no fractures. No lacerations nor skin openings. Overall clinical picture most c/w contusion. Recommend rest, ice, tylenol  or  motrin PRN. Wear wide shoes for comfort. May buddy tape for comfort  Patient will return for new or worsening symptoms. I discussed the findings today, diagnosis/differential diagnosis, plan and red flags that require return for reevaluation with PCP, Urgent care or EMERGENCY. Patient was agreeable to outlined plan and questions were answered, felt stable for discharge.     All pertinent previous records reviewed prior to and during visit.   Home Care   Electronically signed by: Jon Earnie Flank, NP 07/14/2024 4:57 PM

## 2024-08-17 ENCOUNTER — Ambulatory Visit: Admitting: Infectious Diseases

## 2024-08-17 ENCOUNTER — Encounter: Payer: Self-pay | Admitting: Infectious Diseases

## 2024-08-17 ENCOUNTER — Other Ambulatory Visit: Payer: Self-pay

## 2024-08-17 VITALS — BP 162/96 | HR 68 | Temp 97.5°F | Ht 70.0 in | Wt 177.0 lb

## 2024-08-17 DIAGNOSIS — Z79899 Other long term (current) drug therapy: Secondary | ICD-10-CM

## 2024-08-17 DIAGNOSIS — K2920 Alcoholic gastritis without bleeding: Secondary | ICD-10-CM

## 2024-08-17 DIAGNOSIS — F1010 Alcohol abuse, uncomplicated: Secondary | ICD-10-CM

## 2024-08-17 DIAGNOSIS — F419 Anxiety disorder, unspecified: Secondary | ICD-10-CM | POA: Diagnosis not present

## 2024-08-17 DIAGNOSIS — B20 Human immunodeficiency virus [HIV] disease: Secondary | ICD-10-CM | POA: Diagnosis not present

## 2024-08-17 DIAGNOSIS — K2100 Gastro-esophageal reflux disease with esophagitis, without bleeding: Secondary | ICD-10-CM

## 2024-08-17 DIAGNOSIS — R09A2 Foreign body sensation, throat: Secondary | ICD-10-CM

## 2024-08-17 DIAGNOSIS — Z0000 Encounter for general adult medical examination without abnormal findings: Secondary | ICD-10-CM

## 2024-08-17 MED ORDER — PANTOPRAZOLE SODIUM 40 MG PO TBEC: 40.0000 mg | DELAYED_RELEASE_TABLET | Freq: Every day | ORAL | 0 refills | Status: DC

## 2024-08-17 NOTE — Progress Notes (Signed)
 I saw the patient independently of the student NP and conducted my own interview / exam.  I agree with the documentation provided with the following additions:   Discussed the use of AI scribe software for clinical note transcription with the patient, who gave verbal consent to proceed.  History of Present Illness   Ronnie Marsh is a 23 year old male with HIV who presents with increased alcohol use and gastrointestinal symptoms.  Since August, he has increased his alcohol consumption as a coping mechanism for anxiety, feelings of worthlessness, and depression. He reports using alcohol to help him sleep through the night and to reduce irritability, but it also causes nausea and dizziness. He uses alcohol to achieve a 'buzz' to 'turn stuff off'.  He has experienced gastrointestinal symptoms, including vomiting 'acid mixed with blood' and a tight, bloated feeling in the stomach, particularly around the liver area. These symptoms have been present since the weekend, with vomiting occurring late Saturday night. He has been unable to eat since then and reports that food does not settle well.  He describes a sensation in his throat that has been present for about a month, feeling like a small lump, which is not painful but uncomfortable, especially when swallowing. It sometimes gives a sour taste in his throat.  He has a history of anxiety and depression, exacerbated by the anniversary of his grandmother's passing in 2021, which tends to worsen around his birthday and during colder months. He has stopped smoking marijuana.  He reports work-related stress due to increased workload and tight deadlines, which has contributed to his current stress levels.       Review of Systems  HENT:  Positive for voice change. Negative for dental problem and mouth sores. Sore throat: globus sensation in throat on right.  Respiratory: Negative.    Gastrointestinal:  Positive for abdominal pain, nausea and vomiting.  Negative for diarrhea.  Genitourinary: Negative.     Physical Exam Constitutional:      Appearance: Normal appearance. He is not ill-appearing.  HENT:     Head: Normocephalic.     Salivary Glands: Right salivary gland is not tender. Left salivary gland is not tender.     Mouth/Throat:     Lips: Pink.     Mouth: Mucous membranes are dry.     Dentition: No dental abscesses or gum lesions.     Tongue: No lesions.     Pharynx: Oropharynx is clear. No oropharyngeal exudate.     Tonsils: No tonsillar exudate.  Eyes:     General: No scleral icterus. Neck:     Thyroid: No thyroid mass, thyromegaly or thyroid tenderness.     Trachea: Trachea and phonation normal.  Cardiovascular:     Rate and Rhythm: Normal rate.  Pulmonary:     Effort: Pulmonary effort is normal.  Musculoskeletal:        General: Normal range of motion.     Cervical back: Normal range of motion.  Lymphadenopathy:     Cervical: No cervical adenopathy.  Skin:    Coloration: Skin is not jaundiced or pale.  Neurological:     Mental Status: He is alert and oriented to person, place, and time.  Psychiatric:        Mood and Affect: Mood normal.        Judgment: Judgment normal.      HIV 1 RNA Quant  Date Value  04/02/2024 <20 DETECTED copies/mL (A)  11/19/2023 49 Copies/mL (H)  05/31/2023  60 Copies/mL (H)   CD4 T Cell Abs (/uL)  Date Value  04/02/2024 367 (L)  11/19/2023 229 (L)  08/23/2022 174 (L)   Lab Results  Component Value Date   ALT 18 11/19/2023   AST 20 11/19/2023   ALKPHOS 54 06/10/2019   BILITOT 0.4 11/19/2023   Lab Results  Component Value Date   CREATININE 1.12 11/19/2023   Assessment and Plan    Human immunodeficiency virus (HIV) disease - HIV disease is well-managed with Biktarvy . No new symptoms or complications related to HIV were discussed. - Continue Biktarvy  as prescribed.  Alcohol use disorder - Increased alcohol consumption since August as a coping mechanism for anxiety  and depression. Reports nausea, vomiting, and inability to eat after drinking. Acknowledges need to reduce alcohol intake. No formal treatment sought at this time, he will give consideration to needs in the future as he works on decreasing use  - Encouraged reduction of alcohol intake. - Discussed potential benefits of counseling for alcohol use disorder. - Provided information on outpatient treatment options and support groups if needed.  Dysthymic mood and anxiety symptoms - Experiencing increased anxiety and feelings of worthlessness, potentially exacerbated by seasonal affective disorder. Symptoms include difficulty sleeping and irritability. No current medication for depression or anxiety. Counseling recommended as a first-line intervention. - Referred to counselor Reche for therapy sessions. - Discussed potential use of light therapy for seasonal affective disorder. - Encouraged daily sunlight exposure for at least 15 minutes. - We discussed symptoms that would need to consider re-evaluation for medication management.  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 Experiences throat foreign body sensation and sour taste, possibly due to gastroesophageal reflux. No thyroid abnormalities noted on exam. Prilosec recommended to address likely gastritis and reflux symptoms. - Recommended trial of Prilosec (pantoprazole ) for 2 weeks to 1 month. - Advised against use of Tums or Rolaids due to potential interference with Biktarvy  absorption. - Will consider referral to ENT if symptoms persist.       Orders Placed This Encounter  Procedures   HIV 1 RNA quant-no reflex-bld   T-helper cells (CD4) count   Ambulatory referral to Psychology    Referral Priority:   Routine    Referral Type:   Psychiatric    Referral Reason:    Specialty Services Required    Requested Specialty:   Psychology    Number of Visits Requested:   1   Meds ordered this encounter  Medications   pantoprazole  (PROTONIX ) 40 MG tablet    Sig: Take 1 tablet (40 mg total) by mouth daily.    Dispense:  30 tablet    Refill:  0    Corean Fireman, MSN, NP-C [facility] for Infectious Disease [facility]  [email or web address] Pager: [phone] Office: [phone] [facility] Main Line: [phone] *Secure Chat Communication Welcome

## 2024-08-17 NOTE — Patient Instructions (Addendum)
 Schedule an intake with Ronnie Marsh at her first available Wednesday   Prilosec is safe to take - would treat your gastri  Photo / Light boxes for SAD   -Suspected GASTRITIS AND GASTROESOPHAGEAL REFLUX WITH THROAT FOREIGN BODY SENSATION: Your symptoms of vomiting blood and acid are likely due to alcohol-induced gastritis, and the sensation in your throat may be due to gastroesophageal reflux. We recommend trying Prilosec or Prescription Pantoprazole  for 2 weeks to 1 month. Avoid using Tums or Rolaids as they can interfere with your HIV medication. If symptoms persist, we may refer you to an ENT specialist.  INSTRUCTIONS:  Please follow up with counselor Ronnie Marsh for therapy sessions. If your gastrointestinal symptoms do not improve with Prilosec, let us  know so we can consider a referral to an ENT specialist.

## 2024-08-17 NOTE — Progress Notes (Addendum)
 Patient: Ronnie Marsh  DOB: [birth year] MRN: [id number] PCP: Leila Lucie LABOR, MD  Referring Provider:   Reason for Visit: Follow-up on HIV management   Chief Complaint  Patient presents with   Follow-up      Subjective   Subjective:   Ronnie Marsh is a 23 year old male presenting in clinic for his HIV follow-up care. Since his last visit on 04/02/2024, he reports no recent hospitalizations or ER visits. His current regiment include daily Biktarvy  and reports daily adherence, no missed doses, adverse effects, or barriers in obtaining medication. Last viral load <20 and CD4 367. He reports that lately he has been struggling with feeling of anxiety. He states he feels as if he is not achieving or reaching certain goals for his age. He also states feeling flustered and becoming more irritable. He states he does not have good coping mechanisms as he mask or suppressing his feelings. He also states that he has been consuming more alcohol lately and can drink a whole bottle of tequila. He reports having to soul search. He denies SI/HI at this time. He states not being sexually active. He currently works full-time and reports stable housing, food, and transportation. He reports no fever, chills, night sweats, chest pain, or shortness of breath. He has no concerns at this time.    Review of Systems  Constitutional: Negative.   Respiratory: Negative.    Cardiovascular: Negative.   Gastrointestinal: Negative.   Genitourinary: Negative.   Musculoskeletal: Negative.   Skin: Negative.   Neurological: Negative.   Psychiatric/Behavioral: Negative.      Past Medical History:  Diagnosis Date   Asymptomatic HIV infection, with no history of HIV-related illness (HCC) 03/15/2021   Syphilis 03/15/2021    Outpatient Medications Prior to Visit  Medication Sig Dispense Refill   bictegravir-emtricitabine -tenofovir  AF (BIKTARVY ) 50-200-25 MG TABS tablet Take 1 tablet by mouth daily. 30  tablet 11   No facility-administered medications prior to visit.     Allergies  Allergen Reactions   Pineapple Itching    Raw pineapple makes his tongue/mouth itch    Social History   Tobacco Use   Smoking status: Former    Types: Cigarettes   Smokeless tobacco: Never  Vaping Use   Vaping status: Never Used  Substance Use Topics   Alcohol use: Yes    Comment: socially   Drug use: Yes    Types: Marijuana    Comment: socially    No family history on file.     Objective   Objective:   Vitals:   08/17/24 1045  BP: (!) 162/96  Pulse: 68  Temp: (!) 97.5 F (36.4 C)  TempSrc: Temporal  SpO2: 100%  Weight: 177 lb (80.3 kg)  Height: 5' 10 (1.778 m)   Body mass index is 25.4 kg/m.  Physical Exam Vitals and nursing note reviewed.  Constitutional:      Appearance: Normal appearance.  Cardiovascular:     Rate and Rhythm: Normal rate and regular rhythm.  Pulmonary:     Effort: Pulmonary effort is normal.     Breath sounds: Normal breath sounds.  Musculoskeletal:        General: Normal range of motion.  Skin:    General: Skin is warm and dry.  Neurological:     Mental Status: He is alert and oriented to person, place, and time.  Psychiatric:        Mood and Affect: Mood normal.  Behavior: Behavior normal.        Thought Content: Thought content normal.        Judgment: Judgment normal.        Assessment & Plan:   Problem List Items Addressed This Visit   None Visit Diagnoses       Human immunodeficiency virus (HIV) disease (HCC)    -  Primary     Healthcare maintenance         Anxiety       Relevant Orders   Ambulatory referral to Psychology     Globus sensation           Assessment and Plan  -Human immunodeficiency virus (HIV) disease Current regiment includes daily Biktarvy . He reports daily adherence, no missed doses, adverse effects, and no barriers in obtaining medication. Last viral load was undetectable and CD4 367. Will  refill Biktarvy  today and order HIV-1 RNA, CD4, and CMP.   -Healthcare maintenance  He reports not being sexually active and declines STI screening today. Immunizations reviewed and offered Manveo and Prevnar 20. He declines today but will consider for next visit. Preventive care reviewed and up to date. Anal pap at 23 years of age. PSA and colonoscopy at 23 years of age.   -Anxiety  He reports feelings of anxiety. He reports feeling as if he is not achieving or not meeting goals for his age, which lead him to feel flustered or irritable. He reports masking or suppressing his feelings. He reports not having good coping mechanisms can drink a bottle of tequila. He reports no SI/HI today. Will offer counseling resources today .       Orders Placed This Encounter  Procedures   Ambulatory referral to Psychology    Referral Priority:   Routine    Referral Type:   Psychiatric    Referral Reason:   Specialty Services Required    Requested Specialty:   Psychology    Number of Visits Requested:   1    Meds ordered this encounter  Medications   pantoprazole  (PROTONIX ) 40 MG tablet    Sig: Take 1 tablet (40 mg total) by mouth daily.    Dispense:  30 tablet    Refill:  0    No follow-ups on file.

## 2024-08-20 ENCOUNTER — Encounter (INDEPENDENT_AMBULATORY_CARE_PROVIDER_SITE_OTHER): Payer: Self-pay

## 2024-08-20 DIAGNOSIS — R09A2 Foreign body sensation, throat: Secondary | ICD-10-CM

## 2024-08-20 DIAGNOSIS — R499 Unspecified voice and resonance disorder: Secondary | ICD-10-CM

## 2024-08-20 LAB — HIV-1 RNA QUANT-NO REFLEX-BLD
HIV 1 RNA Quant: NOT DETECTED {copies}/mL
HIV-1 RNA Quant, Log: NOT DETECTED {Log_copies}/mL

## 2024-08-20 LAB — T-HELPER CELLS (CD4) COUNT (NOT AT ARMC)
Absolute CD4: 265 {cells}/uL — ABNORMAL LOW (ref 490–1740)
CD4 T Helper %: 25 % — ABNORMAL LOW (ref 30–61)
Total lymphocyte count: 1063 {cells}/uL (ref 850–3900)

## 2024-08-25 ENCOUNTER — Ambulatory Visit: Payer: Self-pay | Admitting: Infectious Diseases

## 2024-09-09 ENCOUNTER — Ambulatory Visit: Admitting: Licensed Clinical Social Worker

## 2024-09-22 ENCOUNTER — Encounter (INDEPENDENT_AMBULATORY_CARE_PROVIDER_SITE_OTHER): Payer: Self-pay

## 2024-09-22 ENCOUNTER — Ambulatory Visit (INDEPENDENT_AMBULATORY_CARE_PROVIDER_SITE_OTHER)

## 2024-09-22 VITALS — BP 120/82 | HR 80 | Temp 98.0°F | Wt 177.0 lb

## 2024-09-22 DIAGNOSIS — K219 Gastro-esophageal reflux disease without esophagitis: Secondary | ICD-10-CM | POA: Diagnosis not present

## 2024-09-22 DIAGNOSIS — R09A2 Foreign body sensation, throat: Secondary | ICD-10-CM | POA: Diagnosis not present

## 2024-09-22 DIAGNOSIS — J342 Deviated nasal septum: Secondary | ICD-10-CM

## 2024-09-22 MED ORDER — OMEPRAZOLE 20 MG PO CPDR: 20.0000 mg | DELAYED_RELEASE_CAPSULE | Freq: Every day | ORAL | 1 refills | Status: AC

## 2024-09-22 NOTE — Progress Notes (Signed)
 Dear Dr. Melvenia, Here is my assessment for our mutual patient, Ronnie Marsh. Thank you for allowing me the opportunity to care for your patient. Please do not hesitate to contact me should you have any other questions. Sincerely, Dr. Penne Croak  Otolaryngology Clinic Note Referring provider: Dr. Melvenia HPI:  Discussed the use of AI scribe software for clinical note transcription with the patient, who gave verbal consent to proceed.  History of Present Illness Ronnie Marsh is a 24 year old male who presents with three weeks of persistent globus sensation.  Globus Sensation: - Persistent foreign body sensation in the throat for three weeks - Localized without radiation - Constant, most noticeable during swallowing - No associated choking, dysphagia, or respiratory compromise - No expectoration, history of tonsilloliths, acute food impaction, or foreign body ingestion  Pharyngolaryngeal Symptoms: - Intermittent mild sore throat, attributed to repeated swallowing - Discomfort is persistent but not severe - Mild, intermittent voice changes, particularly in the mornings, described as strained or cracky - Voice changes improve after eating - No significant impact on breathing or swallowing  Response to Interventions: - Salt water gargles, eating, drinking, and spicy foods have not provided relief - Trialed over-the-counter medication for gastric irritation for one week, discontinued due to stomach pain  Alcohol Use History: - History of heavy alcohol use, now reduced to weekends or every other weekend following an episode of hematemesis - No caffeine consumption  Independent Review of Additional Tests or Records:  Reviewed external note from referring PCP, Dixon,describing relevant history incorporated into todays evaluation.   PMH/Meds/All/SocHx/FamHx/ROS:   Past Medical History:  Diagnosis Date   Asymptomatic HIV infection, with no history of HIV-related illness (HCC)  03/15/2021   Syphilis 03/15/2021     Past Surgical History:  Procedure Laterality Date   OPEN REDUCTION INTERNAL FIXATION (ORIF) METACARPAL Right 06/23/2020   Procedure: OPEN REDUCTION INTERNAL FIXATION (ORIF) METACARPA RIGHT SMALL FINGER DISTAL PHALANX;  Surgeon: Murrell Kuba, MD;  Location: [facility];  Service: Orthopedics;  Laterality: Right;    History reviewed. No pertinent family history.   Social Connections: Socially Isolated (12/07/2021)   Received from [facility]   Social Network    How would you rate your social network (family, work, friends)?: Little participation, lonely and socially isolated     Current Medications[1]   Physical Exam:   BP 120/82 (BP Location: Left Arm, Patient Position: Sitting, Cuff Size: Normal)   Pulse 80   Temp 98 F (36.7 C)   Wt 177 lb (80.3 kg)   SpO2 97%   BMI 25.40 kg/m   The patient was awake, alert, and appropriate. The external ears were inspected, and otoscopy was performed to evaluate the external auditory canals and tympanic membranes. The nasal cavity and septum were examined for mucosal changes, obstruction, or discharge. The oral cavity and oropharynx were inspected for mucosal lesions, infection, or tonsillar hypertrophy. The neck was palpated for lymphadenopathy, thyroid abnormalities, or other masses. Cranial nerve function was grossly intact.  Pertinent Findings: General: Well developed, well nourished. No acute distress. Voice without hoarseness Head/Face: Normocephalic. No sinus tenderness. Facial nerve intact and equal bilaterally. No facial lacerations. Eyes: PERRL, no scleral icterus or conjunctival hemorrhage. EOMI. Ears: No gross deformity. Normal external canal. Tympanic membrane with normal landmarks bilaterally Hearing: Normal speech reception.   Mouth/Oropharynx: Lips without any lesions. Dentition . No mucosal lesions within the oropharynx. No tonsillar enlargement, exudate, or lesions. Pharyngeal  walls symmetrical. Uvula midline. Tongue midline without  lesions. Larynx: See TFL if applicable Nasopharynx: See TFL if applicable Neck: Trachea midline. No masses. No thyromegaly or nodules palpated. No crepitus. Lymphatic: No lymphadenopathy in the neck. Respiratory: No stridor or distress. Room air. Cardiovascular: Regular rate and rhythm. Extremities: No edema or cyanosis. Warm and well-perfused. Skin: No scars or lesions on face or neck. Neurologic: CN II-XII grossly intact. Moving all extremities without gross abnormality. Other:  Physical Exam HEENT: Atraumatic, normocephalic. Nasal passages examined with no abnormalities. Nasal septum deviated, more open on one side. Throat normal, no cancer or infection observed.   Seprately Identifiable Procedures:  I personally ordered, reviewed and interpreted the following with the patient today  Given the patient's symptoms and incomplete visualization of critical sinonasal areas with anterior rhinoscopy, a separately performed diagnostic nasal endoscopy procedure is indicated for a complete rhinologic evaluation per American Rhinologic Society recommendations (https://[email or web address]-rhinologic.org/position-statements)  I personally ordered, reviewed and interpreted the following with the patient today   Procedure Note Pre-procedure diagnosis:  globus sensation Post-procedure diagnosis: Same Procedure: Transnasal Fiberoptic Laryngoscopy, CPT 31575 - Mod 25 Indication: globus sensation x 3 weeks, rule out foreign body Complications: None apparent EBL: 0 mL  The procedure was undertaken to further evaluate the patient's complaint of globus sensation x 3 weeks, with mirror exam inadequate for appropriate examination due to gag reflex and poor patient tolerance  Procedure:  Patient was identified as correct patient. Verbal consent was obtained. The nose was sprayed with oxymetazoline and 4% lidocaine . The The flexible laryngoscope was passed  through the nose to view the nasal cavity, pharynx (oropharynx, hypopharynx) and larynx.  The larynx was examined at rest and during multiple phonatory tasks. Documentation was obtained and reviewed with patient. The scope was removed. The patient tolerated the procedure well.  Findings: The nasal cavity and nasopharynx did not reveal any masses or lesions, mucosa appeared to be without obvious lesions. The tongue base, pharyngeal walls, piriform sinuses, vallecula, epiglottis and postcricoid region are normal in appearance EXCEPT: post cricoid edema and erythema. The visualized portion of the subglottis and proximal trachea is widely patent. The vocal folds are mobile bilaterally. There are no lesions on the free edge of the vocal folds nor elsewhere in the larynx worrisome for malignancy.    Electronically signed by: Penne Croak, DO 09/22/2024 4:33 PM  Impression & Plans:  Asher Torpey is a 24 y.o. male  1. Globus sensation   2. Laryngopharyngeal reflux (LPR)   3. DNS (deviated nasal septum)    - Findings and diagnoses discussed in detail with the patient. - Risks, benefits, and alternatives were reviewed. Through shared decision making, the patient elects to proceed with below. Assessment & Plan Globus sensation Chronic globus sensation for three weeks without dysphagia, odynophagia, or respiratory compromise. No alarming features identified; symptoms likely benign. - Performed flexible nasolaryngoscopy, unremarkable for foreign body, infection, or malignancy. - Provided reassurance regarding absence of concerning findings. - Recommended follow-up in eight weeks if symptoms persist or worsen.  Laryngopharyngeal reflux Mild laryngeal changes consistent with laryngopharyngeal reflux. Gastric irritation and prior hematemesis likely related to heavy alcohol use. Current globus sensation and intermittent voice changes may be attributable to reflux. - Prescribed acid suppression therapy to be  taken before breakfast. - Discussed lifestyle modifications including avoidance of spicy foods, alcohol, acidic foods, and caffeine. - Recommended Reflex Gourmet gel as adjunct or alternative to prescription therapy, available over the counter or online. - Advised follow-up in eight weeks to reassess symptoms and response to therapy.  -  Orders placed: No orders of the defined types were placed in this encounter.  - Medications prescribed/continued/adjusted:  Meds ordered this encounter  Medications   omeprazole  (PRILOSEC) 20 MG capsule    Sig: Take 1 capsule (20 mg total) by mouth daily before breakfast.    Dispense:  90 capsule    Refill:  1   - Education materials provided to the patient. - Follow up: 8 weeks. Patient instructed to return sooner or go to the ED if new/worsening symptoms develop.   Thank you for allowing me the opportunity to care for your patient. Please do not hesitate to contact me should you have any other questions.  Sincerely, Penne Croak, DO Otolaryngologist (ENT) [facility] Phone: [phone] Fax: [phone]  09/22/2024, 4:33 PM        [1]  Current Outpatient Medications:    bictegravir-emtricitabine -tenofovir  AF (BIKTARVY ) 50-200-25 MG TABS tablet, Take 1 tablet by mouth daily., Disp: 30 tablet, Rfl: 11   omeprazole  (PRILOSEC) 20 MG capsule, Take 1 capsule (20 mg total) by mouth daily before breakfast., Disp: 90 capsule, Rfl: 1

## 2024-11-17 ENCOUNTER — Ambulatory Visit (INDEPENDENT_AMBULATORY_CARE_PROVIDER_SITE_OTHER)

## 2024-12-17 ENCOUNTER — Ambulatory Visit: Admitting: Infectious Diseases
